# Patient Record
Sex: Female | Born: 1970 | Race: White | Hispanic: No | Marital: Married | Smoking: Never smoker
Health system: Southern US, Community
[De-identification: ages and names within clinical notes are randomized; demographics above are authoritative.]

## PROBLEM LIST (undated history)

## (undated) DIAGNOSIS — E063 Autoimmune thyroiditis: Secondary | ICD-10-CM

## (undated) DIAGNOSIS — K5900 Constipation, unspecified: Secondary | ICD-10-CM

## (undated) DIAGNOSIS — E559 Vitamin D deficiency, unspecified: Secondary | ICD-10-CM

## (undated) DIAGNOSIS — D259 Leiomyoma of uterus, unspecified: Secondary | ICD-10-CM

## (undated) DIAGNOSIS — T7840XA Allergy, unspecified, initial encounter: Secondary | ICD-10-CM

## (undated) DIAGNOSIS — E041 Nontoxic single thyroid nodule: Secondary | ICD-10-CM

## (undated) DIAGNOSIS — I10 Essential (primary) hypertension: Secondary | ICD-10-CM

## (undated) DIAGNOSIS — E039 Hypothyroidism, unspecified: Secondary | ICD-10-CM

## (undated) DIAGNOSIS — N920 Excessive and frequent menstruation with regular cycle: Secondary | ICD-10-CM

## (undated) DIAGNOSIS — E669 Obesity, unspecified: Secondary | ICD-10-CM

## (undated) HISTORY — DX: Constipation, unspecified: K59.00

## (undated) HISTORY — DX: Obesity, unspecified: E66.9

## (undated) HISTORY — DX: Allergy, unspecified, initial encounter: T78.40XA

## (undated) HISTORY — DX: Hypothyroidism, unspecified: E03.9

## (undated) HISTORY — DX: Essential (primary) hypertension: I10

## (undated) HISTORY — DX: Nontoxic single thyroid nodule: E04.1

## (undated) HISTORY — DX: Vitamin D deficiency, unspecified: E55.9

---
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
## 1997-08-21 HISTORY — PX: LAPAROSCOPIC CHOLECYSTECTOMY: SUR755

## 1997-09-18 HISTORY — PX: TUBAL LIGATION: SHX77

## 2005-07-21 HISTORY — PX: HYSTEROSCOPY WITH D & C: SHX1775

## 2013-01-04 ENCOUNTER — Ambulatory Visit: Payer: Self-pay | Admitting: Physician Assistant

## 2013-01-07 LAB — TSH: TSH: 10 u[IU]/mL — AB (ref <=5.90)

## 2013-07-22 ENCOUNTER — Ambulatory Visit: Payer: Self-pay | Admitting: Internal Medicine

## 2014-03-03 ENCOUNTER — Ambulatory Visit: Payer: Self-pay | Admitting: Physician Assistant

## 2014-03-24 ENCOUNTER — Other Ambulatory Visit: Payer: Self-pay | Admitting: Otolaryngology

## 2014-03-24 DIAGNOSIS — E041 Nontoxic single thyroid nodule: Secondary | ICD-10-CM

## 2014-04-04 LAB — TSH: TSH: 8.3 u[IU]/mL — AB (ref <=5.90)

## 2014-04-05 ENCOUNTER — Ambulatory Visit
Admission: RE | Admit: 2014-04-05 | Discharge: 2014-04-05 | Disposition: A | Discharge status: Home / Self Care | Payer: BC Managed Care – PPO | Source: Ambulatory Visit | Attending: Otolaryngology | Admitting: Otolaryngology

## 2014-04-05 ENCOUNTER — Other Ambulatory Visit: Payer: Self-pay | Admitting: Otolaryngology

## 2014-04-05 DIAGNOSIS — E041 Nontoxic single thyroid nodule: Secondary | ICD-10-CM

## 2014-04-06 ENCOUNTER — Encounter: Payer: Self-pay | Admitting: Internal Medicine

## 2014-04-24 ENCOUNTER — Encounter: Payer: Self-pay | Admitting: Endocrinology

## 2014-04-24 ENCOUNTER — Ambulatory Visit (INDEPENDENT_AMBULATORY_CARE_PROVIDER_SITE_OTHER): Payer: BC Managed Care – PPO | Admitting: Endocrinology

## 2014-04-24 VITALS — BP 142/92 | HR 98 | Temp 97.6°F | Resp 14 | Ht 63.0 in | Wt 163.2 lb

## 2014-04-24 DIAGNOSIS — R5383 Other fatigue: Secondary | ICD-10-CM

## 2014-04-24 DIAGNOSIS — E039 Hypothyroidism, unspecified: Secondary | ICD-10-CM

## 2014-04-24 MED ORDER — SYNTHROID 125 MCG PO TABS: 125.0000 ug | ORAL_TABLET | Freq: Every day | ORAL | Status: DC

## 2014-04-24 NOTE — Progress Notes (Signed)
Patient ID: Brenda Schneider, female   DOB: 09/29/1970, 43 y.o.   MRN: [id number]   Reason for Appointment:  Hypothyroidism, new visit    History of Present Illness:   Her hypothyroidism  was first diagnosed in 2011  At that time she was having the following symptoms: fatigue, weight gain, depression, heavy menstrual cycles, headaches and higher blood pressure. She had symptoms for about  6 months before seen by her physician Not clear what her initial thyroid levels were but she was told to have hypothyroidism and started on the brand name Synthroid 100 mcg With this she felt significantly better although not back to normal Apparently her dose was continued unchanged for a couple of years  She has been with a new physician locally for the last 2 years or so and she thinks her symptoms of hypothyroidism have been present to some degree as above. She does tend to have fatigue but also has good and bad days  She has extensive records over the last year and a half and these were reviewed  She has been getting the generic Synthroid and the dose was increased to 112 mcg in 2014 Apparently when her TSH was relatively low in 10/14  the dose was reduced, followup level in 1/15 is not available Until 9/15 she was taking 100 mcg alternating with 112 and since then has been taking 112 mcg She still has fatigue and some headaches She has lost weight with the help of phentermine  Lab Results  Component Value Date   TSH 8.30* 04/04/2014   TSH 10.00* 01/07/2013      No past medical history on file.  No past surgical history on file.  Family History  Problem Relation Age of Onset  . Cancer Mother   . Thyroid disease Paternal Grandmother   . Heart disease Paternal Grandmother   . Diabetes Neg Hx     Social History:  reports that she has never smoked. She has never used smokeless tobacco. Her alcohol and drug histories are not on file.  Allergies: No Known Allergies    Medication List        This list is accurate as of: 04/24/14  5:13 PM.  Always use your most recent med list.               amoxicillin 500 MG tablet  Commonly known as:  AMOXIL  Take 500 mg by mouth 2 (two) times daily.     ibuprofen 400 MG tablet  Commonly known as:  ADVIL,MOTRIN  Take 400 mg by mouth every 6 (six) hours as needed.     phentermine 15 MG capsule  Take 15 mg by mouth every morning.     SYNTHROID 125 MCG tablet  Generic drug:  levothyroxine  Take 1 tablet (125 mcg total) by mouth daily before breakfast.     triamterene-hydrochlorothiazide 37.5-25 MG per capsule  Commonly known as:  DYAZIDE  Take 1 capsule by mouth daily.        Review of Systems:  CARDIOLOGY:  she has a  history of mildly increased blood pressure, currently on Dyazide        GASTROENTEROLOGY:  no Change in bowel habits.      ENDOCRINOLOGY:  no history of Diabetes.      She has lost about 30 pounds in the last 6-9 months with the use of Phentermine 37.5 mg from PCP No history of swelling of the feet  No history of numbness or tingling in  her feet  She has regular menstrual cycles, somewhat heavy at times   Examination:    BP 142/92  Pulse 98  Temp(Src) 97.6 F (36.4 C)  Resp 14  Ht 5\' 3"  (1.6 m)  Wt 163 lb 3.2 oz (74.027 kg)  BMI 28.92 kg/m2  SpO2 96%   General Appearance: pleasant, mild generalized obesity present. No cushingoid features          Eyes: No proptosis or eyelid swelling .          Neck: The thyroid is just palpable and soft in the right side, no nodule felt to There is no lymphadenopathy .    Cardiovascular: Normal  heart sounds, no murmur Respiratory:  Lungs clear Gastrointestinal: Exam not indicated, abdomen nondistended      Neurological: REFLEXES: at biceps are normal.     Skin:  warm, no rash, pigmentary changes or hirsutism        Assessments:   1. Hypothyroidism primary secondary to Hashimoto thyroiditis and minimal thyroid enlargement or Over the last year appears to  be needing relatively higher doses of thyroid supplement Since she continues to have relatively high TSH of 8.3 with an average of 106 mcg of levothyroxine we'll need to increase her dose significantly Also discussed needing to try a brand name Synthroid because of inconsistencies in her therapeutic response over the last year and a half  2. Fatigue is likely to be from hypothyroidism Discussed that if she does not have consistent improvement in her energy level may possibly try Armour Thyroid  3. Generalized obesity. She has lost a significant amount of weight with phentermine but appears to have increased blood pressure and sinus tachycardia. Advised to discuss stopping phentermine with PCP as it is not advisable long-term  Treatment:  She will start brand name Synthroid 125 mcg daily Will have followup thyroid levels done in 6 weeks    Brenda Schneider 04/24/2014, 5:13 PM

## 2014-06-02 ENCOUNTER — Other Ambulatory Visit (INDEPENDENT_AMBULATORY_CARE_PROVIDER_SITE_OTHER): Payer: BC Managed Care – PPO

## 2014-06-02 DIAGNOSIS — E039 Hypothyroidism, unspecified: Secondary | ICD-10-CM

## 2014-06-02 LAB — T4, FREE: Free T4: 0.95 ng/dL (ref 0.60–1.60)

## 2014-06-02 LAB — TSH: TSH: 4.17 u[IU]/mL (ref 0.35–4.50)

## 2014-06-05 ENCOUNTER — Ambulatory Visit (INDEPENDENT_AMBULATORY_CARE_PROVIDER_SITE_OTHER): Payer: BC Managed Care – PPO | Admitting: Endocrinology

## 2014-06-05 ENCOUNTER — Encounter: Payer: Self-pay | Admitting: Endocrinology

## 2014-06-05 VITALS — BP 138/76 | HR 93 | Temp 97.7°F | Resp 16 | Ht 63.0 in | Wt 169.2 lb

## 2014-06-05 DIAGNOSIS — E038 Other specified hypothyroidism: Secondary | ICD-10-CM

## 2014-06-05 DIAGNOSIS — E063 Autoimmune thyroiditis: Secondary | ICD-10-CM | POA: Insufficient documentation

## 2014-06-05 NOTE — Progress Notes (Signed)
Patient ID: Brenda Schneider, female   DOB: 1970/12/06, 43 y.o.   MRN: [id number]   Reason for Appointment:  Hypothyroidism, new visit    History of Present Illness:   Her hypothyroidism  was first diagnosed in 2011  At that time she was having the following symptoms: fatigue, weight gain, depression, heavy menstrual cycles, headaches and higher blood pressure. She had symptoms for about  6 months before seen by her physician Not clear what her initial thyroid levels were but she was told to have hypothyroidism and started on brand name Synthroid 100 mcg With this she felt significantly better although not back to normal Apparently her dose was continued unchanged for a couple of years  She has been with a new physician locally for the last 2 years or so Prior to her initial visit she was having some of her symptoms of hypothyroidism including fatigue and decreased memory She was getting the generic Synthroid and the dose was increased to 112 mcg in 2014 Apparently when her TSH was relatively low in 10/14  the dose was reduced  Until 9/15 she was taking 100 mcg alternating with 112 and subsequently taking 112 mcg  She was changed to brand name Synthroid 125 g in 10/15 and with this her fatigue has improved.  Also has less memory lapses. She has regained some weight recently since she went off her phentermine and also was given prednisone for respiratory infection   Lab Results  Component Value Date   FREET4 0.95 06/02/2014   TSH 4.17 06/02/2014   TSH 8.30* 04/04/2014   TSH 10.00* 01/07/2013      No past medical history on file.  No past surgical history on file.  Family History  Problem Relation Age of Onset  . Cancer Mother   . Thyroid disease Paternal Grandmother   . Heart disease Paternal Grandmother   . Diabetes Neg Hx     Social History:  reports that she has never smoked. She has never used smokeless tobacco. Her alcohol and drug histories are not on  file.  Allergies: No Known Allergies    Medication List       This list is accurate as of: 06/05/14  4:51 PM.  Always use your most recent med list.               ibuprofen 400 MG tablet  Commonly known as:  ADVIL,MOTRIN  Take 400 mg by mouth every 6 (six) hours as needed.     SYNTHROID 125 MCG tablet  Generic drug:  levothyroxine  Take 1 tablet (125 mcg total) by mouth daily before breakfast.     triamterene-hydrochlorothiazide 37.5-25 MG per tablet  Commonly known as:  MAXZIDE-25        Review of Systems:  She had lost about 30 pounds in the last 6-9 months with the use of Phentermine 37.5 mg from PCP which has now been stopped   Wt Readings from Last 3 Encounters:  06/05/14 169 lb 3.2 oz (76.749 kg)  04/24/14 163 lb 3.2 oz (74.027 kg)    CARDIOLOGY:  she has a  history of mildly increased blood pressure, currently on Dyazide         She has regular menstrual cycles, somewhat heavy at times   Examination:    BP 138/76 mmHg  Pulse 93  Temp(Src) 97.7 F (36.5 C)  Resp 16  Ht 5\' 3"  (1.6 m)  Wt 169 lb 3.2 oz (76.749 kg)  BMI 29.98 kg/m2  SpO2 98%   General Appearance: she looks well     Neck: The thyroid is just palpable and soft in the right side, no nodule felt to  Neurological: REFLEXES: at biceps are normal.       Assessments:  Hypothyroidism, primary secondary to Hashimoto thyroiditis and minimal thyroid enlargement  She is subjectively doing better and her TSH is upper normal now with changing her dose about 5-6 weeks ago  Treatment:  She will continue brand name Synthroid 125 mcg daily Will have followup in 3 months    Aylah Yeary 06/05/2014, 4:51 PM

## 2014-08-05 ENCOUNTER — Other Ambulatory Visit: Payer: Self-pay | Admitting: Endocrinology

## 2014-08-23 ENCOUNTER — Other Ambulatory Visit (INDEPENDENT_AMBULATORY_CARE_PROVIDER_SITE_OTHER): Payer: Self-pay

## 2014-08-23 DIAGNOSIS — E063 Autoimmune thyroiditis: Secondary | ICD-10-CM

## 2014-08-23 DIAGNOSIS — E038 Other specified hypothyroidism: Secondary | ICD-10-CM

## 2014-08-23 LAB — TSH: TSH: 2.32 u[IU]/mL (ref 0.35–4.50)

## 2014-08-23 LAB — T4, FREE: FREE T4: 1.07 ng/dL (ref 0.60–1.60)

## 2014-08-30 ENCOUNTER — Other Ambulatory Visit: Payer: BC Managed Care – PPO

## 2014-09-04 ENCOUNTER — Encounter: Payer: Self-pay | Admitting: Endocrinology

## 2014-09-04 ENCOUNTER — Ambulatory Visit (INDEPENDENT_AMBULATORY_CARE_PROVIDER_SITE_OTHER): Payer: BC Managed Care – PPO | Admitting: Endocrinology

## 2014-09-04 VITALS — BP 114/70 | HR 87 | Temp 98.6°F | Resp 12 | Wt 179.0 lb

## 2014-09-04 DIAGNOSIS — E063 Autoimmune thyroiditis: Secondary | ICD-10-CM

## 2014-09-04 DIAGNOSIS — E038 Other specified hypothyroidism: Secondary | ICD-10-CM

## 2014-09-04 NOTE — Progress Notes (Signed)
Patient ID: Brenda Schneider, female   DOB: Jan 11, 1971, 44 y.o.   MRN: [id number]   Reason for Appointment:  Hypothyroidism, new visit    History of Present Illness:   HYPOTHYROIDISM  was first diagnosed in 2011  At that time she was having the following symptoms: fatigue, weight gain, depression, heavy menstrual cycles, headaches and higher blood pressure. She had symptoms for about  6 months before seen by her physician Not clear what her initial thyroid levels were but she was told to have hypothyroidism and started on brand name Synthroid 100 mcg With this she felt significantly better although not back to normal Apparently her dose was continued unchanged for a couple of years  Prior to her initial visit she was having some of her symptoms of hypothyroidism including fatigue and decreased memory She was getting the generic Synthroid and the dose was increased to 112 mcg in 2014 Apparently when her TSH was relatively low in 10/14  the dose was reduced  Until 9/15 she was taking 100 mcg alternating with 112 and subsequently taking 112 mcg  Recent history: She was changed to brand name Synthroid 125 g in 10/15 and with this her fatigue improved.  Also has had less memory lapses. Since her follow-up TSH was done right after her change the dose was continued Her TSH is now quite normal She still continues to feel very well with her energy level and has normal brain fog She has not started working on her weight loss as yet Compliant with taking the medication before breakfast daily  Labs:  Lab Results  Component Value Date   FREET4 1.07 08/23/2014   FREET4 0.95 06/02/2014   TSH 2.32 08/23/2014   TSH 4.17 06/02/2014   TSH 8.30* 04/04/2014      No past medical history on file.  No past surgical history on file.  Family History  Problem Relation Age of Onset  . Cancer Mother   . Thyroid disease Paternal Grandmother   . Heart disease Paternal Grandmother   .  Diabetes Neg Hx     Social History:  reports that she has never smoked. She has never used smokeless tobacco. Her alcohol and drug histories are not on file.  Allergies: No Known Allergies    Medication List       This list is accurate as of: 09/04/14  4:35 PM.  Always use your most recent med list.               ibuprofen 400 MG tablet  Commonly known as:  ADVIL,MOTRIN  Take 400 mg by mouth every 6 (six) hours as needed.     SYNTHROID 125 MCG tablet  Generic drug:  levothyroxine  TAKE 1 TABLET (125 MCG TOTAL) BY MOUTH DAILY BEFORE BREAKFAST.     triamterene-hydrochlorothiazide 37.5-25 MG per tablet  Commonly known as:  MAXZIDE-25  Take 1 tablet by mouth daily.        Review of Systems:  She had lost about 30 pounds before with the use of Phentermine 37.5 mg from PCP     Wt Readings from Last 3 Encounters:  09/04/14 179 lb (81.194 kg)  06/05/14 169 lb 3.2 oz (76.749 kg)  04/24/14 163 lb 3.2 oz (74.027 kg)    She has a  history of mildly increased blood pressure, currently on Dyazide from PCP        She has regular menstrual cycles, somewhat heavy at times   Examination:  BP 114/70 mmHg  Pulse 87  Temp(Src) 98.6 F (37 C) (Oral)  Resp 12  Wt 179 lb (81.194 kg)  SpO2 98%   General Appearance: she looks well     Neck: The thyroid is not palpable Neurological: REFLEXES: at biceps are normal.       Assessments:  Hypothyroidism, primary secondary to Hashimoto thyroiditis and minimal thyroid enlargement  She is subjectively doing better and her TSH is quite normal now with brand name Synthroid 124 g  Treatment:  She will continue brand name Synthroid 125 mcg daily Will have followup in 6 months, advised her to call if she is starting to have unusual fatigue    Vetra Shinall 09/04/2014, 4:35 PM

## 2014-11-24 ENCOUNTER — Other Ambulatory Visit: Payer: Self-pay | Admitting: Endocrinology

## 2014-12-15 ENCOUNTER — Other Ambulatory Visit: Payer: Self-pay | Admitting: Endocrinology

## 2015-03-07 ENCOUNTER — Other Ambulatory Visit: Payer: BLUE CROSS/BLUE SHIELD

## 2015-03-12 ENCOUNTER — Ambulatory Visit: Payer: BLUE CROSS/BLUE SHIELD | Admitting: Endocrinology

## 2015-03-12 DIAGNOSIS — Z0289 Encounter for other administrative examinations: Secondary | ICD-10-CM

## 2015-10-25 NOTE — H&P (Signed)
  45 year old G 3 P 3 with menorrhagia.  Periods last 7 to 10 days Ultrasound revealed 4.5 cm fibroid obliterating her endometrium  Past Medical History  Diagnosis Date  . Hypertension    Hypothyroidism  Past Surgical History Lap Chole BTL D and C Tonsillectomy  Allergies: NKDA  Medications Synthroid  Ibuprofen  There were no vitals taken for this visit.  Family history  Hypertension Asthma  ROS: Unremarkable  General alert and oriented Lung CTAB Car RRR Abdomen is sosft and non tender  Pelvic Uterus  Anteverted 10 week size   IMPRESSION Symptomatic Fibroids  PLAN: LAVH and bilateral salpingectomy Risks reviewed Consent signed

## 2015-10-26 ENCOUNTER — Encounter (HOSPITAL_BASED_OUTPATIENT_CLINIC_OR_DEPARTMENT_OTHER): Payer: Self-pay | Admitting: *Deleted

## 2015-10-29 ENCOUNTER — Encounter (HOSPITAL_BASED_OUTPATIENT_CLINIC_OR_DEPARTMENT_OTHER): Payer: Self-pay | Admitting: *Deleted

## 2015-10-29 NOTE — Progress Notes (Signed)
NPO AFTER MN.  ARRIVE AT 0600.  NEEDS URINE PREG.  GETTING CBC AND T & S DONE Thursday 10-31-2015.  WILL TAKE SYNTHROID AM DOS W/ SIPS OF WATER.

## 2015-10-31 DIAGNOSIS — E039 Hypothyroidism, unspecified: Secondary | ICD-10-CM | POA: Diagnosis not present

## 2015-10-31 DIAGNOSIS — Z6834 Body mass index (BMI) 34.0-34.9, adult: Secondary | ICD-10-CM | POA: Diagnosis not present

## 2015-10-31 DIAGNOSIS — D259 Leiomyoma of uterus, unspecified: Secondary | ICD-10-CM | POA: Diagnosis not present

## 2015-10-31 DIAGNOSIS — N72 Inflammatory disease of cervix uteri: Secondary | ICD-10-CM | POA: Diagnosis not present

## 2015-10-31 DIAGNOSIS — I10 Essential (primary) hypertension: Secondary | ICD-10-CM | POA: Diagnosis not present

## 2015-10-31 DIAGNOSIS — N920 Excessive and frequent menstruation with regular cycle: Secondary | ICD-10-CM | POA: Diagnosis present

## 2015-10-31 LAB — CBC
HCT: 34.4 % — ABNORMAL LOW (ref 36.0–46.0)
Hemoglobin: 10.9 g/dL — ABNORMAL LOW (ref 12.0–15.0)
MCH: 24 pg — ABNORMAL LOW (ref 26.0–34.0)
MCHC: 31.7 g/dL (ref 30.0–36.0)
MCV: 75.8 fL — ABNORMAL LOW (ref 78.0–100.0)
PLATELETS: 274 10*3/uL (ref 150–400)
RBC: 4.54 MIL/uL (ref 3.87–5.11)
RDW: 14.4 % (ref 11.5–15.5)
WBC: 6.7 10*3/uL (ref 4.0–10.5)

## 2015-10-31 LAB — ABO/RH: ABO/RH(D): A NEG

## 2015-11-01 ENCOUNTER — Observation Stay (HOSPITAL_BASED_OUTPATIENT_CLINIC_OR_DEPARTMENT_OTHER)
Admission: AD | Admit: 2015-11-01 | Discharge: 2015-11-02 | Disposition: A | Discharge status: Home / Self Care | Payer: Managed Care, Other (non HMO) | Source: Ambulatory Visit | Attending: Obstetrics and Gynecology | Admitting: Obstetrics and Gynecology

## 2015-11-01 ENCOUNTER — Encounter (HOSPITAL_COMMUNITY)
Admission: AD | Disposition: A | Discharge status: Home / Self Care | Payer: Self-pay | Source: Ambulatory Visit | Attending: Obstetrics and Gynecology

## 2015-11-01 ENCOUNTER — Encounter (HOSPITAL_BASED_OUTPATIENT_CLINIC_OR_DEPARTMENT_OTHER): Payer: Self-pay | Admitting: *Deleted

## 2015-11-01 ENCOUNTER — Ambulatory Visit (HOSPITAL_BASED_OUTPATIENT_CLINIC_OR_DEPARTMENT_OTHER): Payer: Managed Care, Other (non HMO) | Admitting: Anesthesiology

## 2015-11-01 DIAGNOSIS — Z6834 Body mass index (BMI) 34.0-34.9, adult: Secondary | ICD-10-CM | POA: Insufficient documentation

## 2015-11-01 DIAGNOSIS — D259 Leiomyoma of uterus, unspecified: Secondary | ICD-10-CM | POA: Insufficient documentation

## 2015-11-01 DIAGNOSIS — I10 Essential (primary) hypertension: Secondary | ICD-10-CM | POA: Insufficient documentation

## 2015-11-01 DIAGNOSIS — E039 Hypothyroidism, unspecified: Secondary | ICD-10-CM | POA: Insufficient documentation

## 2015-11-01 DIAGNOSIS — N920 Excessive and frequent menstruation with regular cycle: Secondary | ICD-10-CM | POA: Diagnosis not present

## 2015-11-01 DIAGNOSIS — N72 Inflammatory disease of cervix uteri: Secondary | ICD-10-CM | POA: Insufficient documentation

## 2015-11-01 DIAGNOSIS — Z90710 Acquired absence of both cervix and uterus: Secondary | ICD-10-CM | POA: Diagnosis present

## 2015-11-01 HISTORY — DX: Leiomyoma of uterus, unspecified: D25.9

## 2015-11-01 HISTORY — PX: LAPAROSCOPIC VAGINAL HYSTERECTOMY WITH SALPINGECTOMY: SHX6680

## 2015-11-01 HISTORY — DX: Autoimmune thyroiditis: E06.3

## 2015-11-01 HISTORY — DX: Excessive and frequent menstruation with regular cycle: N92.0

## 2015-11-01 LAB — POCT PREGNANCY, URINE: PREG TEST UR: NEGATIVE

## 2015-11-01 SURGERY — HYSTERECTOMY, VAGINAL, LAPAROSCOPY-ASSISTED, WITH SALPINGECTOMY
Anesthesia: General | Site: Abdomen | Laterality: Bilateral

## 2015-11-01 MED ORDER — TRAMADOL HCL 50 MG PO TABS
50.0000 mg | ORAL_TABLET | Freq: Four times a day (QID) | ORAL | Status: DC | PRN
  Administered 2015-11-02: 50 mg via ORAL
  Filled 2015-11-01 (×2): qty 1

## 2015-11-01 MED ORDER — SCOPOLAMINE 1 MG/3DAYS TD PT72
MEDICATED_PATCH | TRANSDERMAL | Status: DC | PRN
  Administered 2015-11-01: 1 via TRANSDERMAL

## 2015-11-01 MED ORDER — LIDOCAINE HCL 4 % EX SOLN
CUTANEOUS | Status: DC | PRN
  Administered 2015-11-01: 2 mL via TOPICAL

## 2015-11-01 MED ORDER — KETOROLAC TROMETHAMINE 30 MG/ML IJ SOLN
30.0000 mg | Freq: Once | INTRAMUSCULAR | Status: AC
  Administered 2015-11-01: 30 mg via INTRAVENOUS
  Filled 2015-11-01: qty 1

## 2015-11-01 MED ORDER — DEXAMETHASONE SODIUM PHOSPHATE 4 MG/ML IJ SOLN
INTRAMUSCULAR | Status: DC | PRN
  Administered 2015-11-01: 10 mg via INTRAVENOUS

## 2015-11-01 MED ORDER — LACTATED RINGERS IR SOLN
Status: DC | PRN
  Administered 2015-11-01: 3000 mL

## 2015-11-01 MED ORDER — CEFAZOLIN SODIUM-DEXTROSE 2-4 GM/100ML-% IV SOLN
INTRAVENOUS | Status: AC
  Filled 2015-11-01: qty 100

## 2015-11-01 MED ORDER — KETOROLAC TROMETHAMINE 30 MG/ML IJ SOLN
INTRAMUSCULAR | Status: AC
  Filled 2015-11-01: qty 1

## 2015-11-01 MED ORDER — CEFAZOLIN SODIUM-DEXTROSE 2-4 GM/100ML-% IV SOLN
2.0000 g | INTRAVENOUS | Status: AC
  Administered 2015-11-01: 2 g via INTRAVENOUS
  Filled 2015-11-01: qty 100

## 2015-11-01 MED ORDER — IBUPROFEN 200 MG PO TABS
600.0000 mg | ORAL_TABLET | Freq: Four times a day (QID) | ORAL | Status: DC | PRN
  Administered 2015-11-02: 600 mg via ORAL
  Filled 2015-11-01: qty 3
  Filled 2015-11-01: qty 1

## 2015-11-01 MED ORDER — LEVOTHYROXINE SODIUM 175 MCG PO TABS
175.0000 ug | ORAL_TABLET | Freq: Every day | ORAL | Status: DC
  Administered 2015-11-02: 175 ug via ORAL
  Filled 2015-11-01 (×3): qty 1

## 2015-11-01 MED ORDER — MENTHOL 3 MG MT LOZG
1.0000 | LOZENGE | OROMUCOSAL | Status: DC | PRN
  Filled 2015-11-01: qty 9

## 2015-11-01 MED ORDER — LACTATED RINGERS IV SOLN
INTRAVENOUS | Status: DC
  Administered 2015-11-01 (×4): via INTRAVENOUS
  Filled 2015-11-01: qty 1000

## 2015-11-01 MED ORDER — MIDAZOLAM HCL 5 MG/5ML IJ SOLN
INTRAMUSCULAR | Status: DC | PRN
  Administered 2015-11-01: 2 mg via INTRAVENOUS

## 2015-11-01 MED ORDER — GLYCOPYRROLATE 0.2 MG/ML IJ SOLN
INTRAMUSCULAR | Status: DC | PRN
  Administered 2015-11-01: 0.6 mg via INTRAVENOUS

## 2015-11-01 MED ORDER — ONDANSETRON HCL 4 MG/2ML IJ SOLN
INTRAMUSCULAR | Status: DC | PRN
  Administered 2015-11-01: 4 mg via INTRAVENOUS

## 2015-11-01 MED ORDER — GLYCOPYRROLATE 0.2 MG/ML IJ SOLN
INTRAMUSCULAR | Status: AC
  Filled 2015-11-01: qty 3

## 2015-11-01 MED ORDER — ONDANSETRON HCL 4 MG/2ML IJ SOLN
INTRAMUSCULAR | Status: AC
  Filled 2015-11-01: qty 2

## 2015-11-01 MED ORDER — MIDAZOLAM HCL 2 MG/2ML IJ SOLN
0.5000 mg | Freq: Once | INTRAMUSCULAR | Status: DC | PRN
  Filled 2015-11-01: qty 2

## 2015-11-01 MED ORDER — DIPHENHYDRAMINE HCL 12.5 MG/5ML PO ELIX
12.5000 mg | ORAL_SOLUTION | Freq: Four times a day (QID) | ORAL | Status: DC | PRN
  Filled 2015-11-01: qty 5

## 2015-11-01 MED ORDER — PROPOFOL 10 MG/ML IV BOLUS
INTRAVENOUS | Status: DC | PRN
  Administered 2015-11-01: 50 mg via INTRAVENOUS
  Administered 2015-11-01: 160 mg via INTRAVENOUS

## 2015-11-01 MED ORDER — ONDANSETRON HCL 4 MG/2ML IJ SOLN
4.0000 mg | Freq: Four times a day (QID) | INTRAMUSCULAR | Status: DC | PRN
  Filled 2015-11-01: qty 2

## 2015-11-01 MED ORDER — LIDOCAINE HCL (CARDIAC) 20 MG/ML IV SOLN
INTRAVENOUS | Status: DC | PRN
  Administered 2015-11-01: 60 mg via INTRAVENOUS

## 2015-11-01 MED ORDER — HYDROMORPHONE HCL 1 MG/ML IJ SOLN
INTRAMUSCULAR | Status: AC
  Filled 2015-11-01: qty 1

## 2015-11-01 MED ORDER — PROMETHAZINE HCL 25 MG/ML IJ SOLN
6.2500 mg | INTRAMUSCULAR | Status: DC | PRN
  Filled 2015-11-01: qty 1

## 2015-11-01 MED ORDER — FENTANYL CITRATE (PF) 250 MCG/5ML IJ SOLN
INTRAMUSCULAR | Status: AC
  Filled 2015-11-01: qty 5

## 2015-11-01 MED ORDER — ROCURONIUM BROMIDE 100 MG/10ML IV SOLN
INTRAVENOUS | Status: AC
  Filled 2015-11-01: qty 1

## 2015-11-01 MED ORDER — HYDROMORPHONE HCL 1 MG/ML IJ SOLN
0.2500 mg | INTRAMUSCULAR | Status: DC | PRN
  Administered 2015-11-01 (×4): 0.5 mg via INTRAVENOUS
  Filled 2015-11-01: qty 1

## 2015-11-01 MED ORDER — LACTATED RINGERS IV SOLN
INTRAVENOUS | Status: DC
  Filled 2015-11-01: qty 1000

## 2015-11-01 MED ORDER — MEPERIDINE HCL 25 MG/ML IJ SOLN
6.2500 mg | INTRAMUSCULAR | Status: DC | PRN
  Filled 2015-11-01: qty 1

## 2015-11-01 MED ORDER — NALOXONE HCL 0.4 MG/ML IJ SOLN
0.4000 mg | INTRAMUSCULAR | Status: DC | PRN
  Filled 2015-11-01: qty 1

## 2015-11-01 MED ORDER — SUCCINYLCHOLINE CHLORIDE 20 MG/ML IJ SOLN
INTRAMUSCULAR | Status: DC | PRN
Start: 2015-11-01 — End: 2015-11-01
  Administered 2015-11-01: 100 mg via INTRAVENOUS

## 2015-11-01 MED ORDER — DEXAMETHASONE SODIUM PHOSPHATE 10 MG/ML IJ SOLN
INTRAMUSCULAR | Status: AC
  Filled 2015-11-01: qty 1

## 2015-11-01 MED ORDER — LIDOCAINE HCL (CARDIAC) 20 MG/ML IV SOLN
INTRAVENOUS | Status: AC
  Filled 2015-11-01: qty 5

## 2015-11-01 MED ORDER — BUPIVACAINE HCL (PF) 0.25 % IJ SOLN
INTRAMUSCULAR | Status: DC | PRN
  Administered 2015-11-01: 10 mL

## 2015-11-01 MED ORDER — ROCURONIUM BROMIDE 100 MG/10ML IV SOLN
INTRAVENOUS | Status: DC | PRN
  Administered 2015-11-01: 10 mg via INTRAVENOUS
  Administered 2015-11-01: 30 mg via INTRAVENOUS
  Administered 2015-11-01: 10 mg via INTRAVENOUS

## 2015-11-01 MED ORDER — MIDAZOLAM HCL 2 MG/2ML IJ SOLN
INTRAMUSCULAR | Status: AC
  Filled 2015-11-01: qty 2

## 2015-11-01 MED ORDER — SODIUM CHLORIDE 0.9% FLUSH
9.0000 mL | INTRAVENOUS | Status: DC | PRN
  Filled 2015-11-01: qty 9

## 2015-11-01 MED ORDER — HYDROMORPHONE 1 MG/ML IV SOLN
INTRAVENOUS | Status: DC
  Administered 2015-11-01: 13:00:00 via INTRAVENOUS
  Administered 2015-11-01: 0.4 mg via INTRAVENOUS
  Administered 2015-11-02: 0.2 mg via INTRAVENOUS
  Filled 2015-11-01 (×2): qty 25

## 2015-11-01 MED ORDER — SCOPOLAMINE 1 MG/3DAYS TD PT72
MEDICATED_PATCH | TRANSDERMAL | Status: AC
  Filled 2015-11-01: qty 1

## 2015-11-01 MED ORDER — DIPHENHYDRAMINE HCL 50 MG/ML IJ SOLN
12.5000 mg | Freq: Four times a day (QID) | INTRAMUSCULAR | Status: DC | PRN
  Filled 2015-11-01: qty 0.25

## 2015-11-01 MED ORDER — PROPOFOL 10 MG/ML IV BOLUS
INTRAVENOUS | Status: AC
  Filled 2015-11-01: qty 40

## 2015-11-01 MED ORDER — FENTANYL CITRATE (PF) 100 MCG/2ML IJ SOLN
INTRAMUSCULAR | Status: DC | PRN
  Administered 2015-11-01 (×5): 50 ug via INTRAVENOUS

## 2015-11-01 MED ORDER — KETOROLAC TROMETHAMINE 30 MG/ML IJ SOLN
INTRAMUSCULAR | Status: DC | PRN
  Administered 2015-11-01: 30 mg via INTRAVENOUS

## 2015-11-01 MED ORDER — NEOSTIGMINE METHYLSULFATE 10 MG/10ML IV SOLN
INTRAVENOUS | Status: DC | PRN
  Administered 2015-11-01: 4 mg via INTRAVENOUS

## 2015-11-01 MED ORDER — NEOSTIGMINE METHYLSULFATE 10 MG/10ML IV SOLN
INTRAVENOUS | Status: AC
  Filled 2015-11-01: qty 1

## 2015-11-01 SURGICAL SUPPLY — 75 items
BAG URINE DRAINAGE (UROLOGICAL SUPPLIES) ×2 IMPLANT
BANDAGE ADHESIVE 1X3 (GAUZE/BANDAGES/DRESSINGS) IMPLANT
BANDAGE CO FLEX L/F 1IN X 5YD (GAUZE/BANDAGES/DRESSINGS) ×2 IMPLANT
BARRIER ADHS 3X4 INTERCEED (GAUZE/BANDAGES/DRESSINGS) IMPLANT
BLADE CLIPPER SURG (BLADE) ×2 IMPLANT
BLADE SURG 11 STRL SS (BLADE) ×2 IMPLANT
CANISTER SUCTION 2500CC (MISCELLANEOUS) IMPLANT
CATH FOLEY 2WAY SLVR  5CC 14FR (CATHETERS) ×1
CATH FOLEY 2WAY SLVR 5CC 14FR (CATHETERS) ×1 IMPLANT
CHLORAPREP W/TINT 26ML (MISCELLANEOUS) ×2 IMPLANT
COVER BACK TABLE 60X90IN (DRAPES) ×4 IMPLANT
DRAPE LG THREE QUARTER DISP (DRAPES) ×2 IMPLANT
DRAPE UNDERBUTTOCKS STRL (DRAPE) ×2 IMPLANT
DRSG TEGADERM 2-3/8X2-3/4 SM (GAUZE/BANDAGES/DRESSINGS) ×2 IMPLANT
DRSG TEGADERM 4X4.75 (GAUZE/BANDAGES/DRESSINGS) ×2 IMPLANT
ELECT REM PT RETURN 9FT ADLT (ELECTROSURGICAL) ×2
ELECTRODE REM PT RTRN 9FT ADLT (ELECTROSURGICAL) ×1 IMPLANT
FILTER SMOKE EVAC LAPAROSHD (FILTER) IMPLANT
GAUZE SPONGE 4X4 16PLY XRAY LF (GAUZE/BANDAGES/DRESSINGS) ×2 IMPLANT
GLOVE BIO SURGEON STRL SZ 6.5 (GLOVE) ×8 IMPLANT
GLOVE BIO SURGEON STRL SZ7 (GLOVE) ×2 IMPLANT
GLOVE BIOGEL PI IND STRL 6.5 (GLOVE) ×3 IMPLANT
GLOVE BIOGEL PI IND STRL 7.5 (GLOVE) ×1 IMPLANT
GLOVE BIOGEL PI INDICATOR 6.5 (GLOVE) ×3
GLOVE BIOGEL PI INDICATOR 7.5 (GLOVE) ×1
GLOVE ECLIPSE 6.5 STRL STRAW (GLOVE) ×2 IMPLANT
GOWN STRL REUS W/ TWL LRG LVL3 (GOWN DISPOSABLE) ×3 IMPLANT
GOWN STRL REUS W/TWL LRG LVL3 (GOWN DISPOSABLE) ×3
GOWN STRL REUS W/TWL XL LVL3 (GOWN DISPOSABLE) ×2 IMPLANT
HOLDER FOLEY CATH W/STRAP (MISCELLANEOUS) IMPLANT
KIT ROOM TURNOVER WOR (KITS) ×2 IMPLANT
LIQUID BAND (GAUZE/BANDAGES/DRESSINGS) IMPLANT
MANIFOLD NEPTUNE II (INSTRUMENTS) IMPLANT
NEEDLE HYPO 25X1 1.5 SAFETY (NEEDLE) IMPLANT
NEEDLE INSUFFLATION 14GA 120MM (NEEDLE) ×2 IMPLANT
NEEDLE INSUFFLATION 14GA 150MM (NEEDLE) IMPLANT
NEEDLE SPNL 22GX3.5 QUINCKE BK (NEEDLE) ×2 IMPLANT
NS IRRIG 500ML POUR BTL (IV SOLUTION) ×2 IMPLANT
PACK BASIN DAY SURGERY FS (CUSTOM PROCEDURE TRAY) ×2 IMPLANT
PAD OB MATERNITY 4.3X12.25 (PERSONAL CARE ITEMS) ×2 IMPLANT
PAD PREP 24X48 CUFFED NSTRL (MISCELLANEOUS) ×2 IMPLANT
PADDING ION DISPOSABLE (MISCELLANEOUS) ×2 IMPLANT
PENCIL BUTTON HOLSTER BLD 10FT (ELECTRODE) ×2 IMPLANT
POUCH SPECIMEN RETRIEVAL 10MM (ENDOMECHANICALS) IMPLANT
SCISSORS LAP 5X35 DISP (ENDOMECHANICALS) IMPLANT
SEALER TISSUE G2 CVD JAW 45CM (ENDOMECHANICALS) ×2 IMPLANT
SET IRRIG TUBING LAPAROSCOPIC (IRRIGATION / IRRIGATOR) ×2 IMPLANT
SHEET LAVH (DRAPES) ×2 IMPLANT
SOLUTION ANTI FOG 6CC (MISCELLANEOUS) ×2 IMPLANT
SPONGE LAP 4X18 X RAY DECT (DISPOSABLE) ×2 IMPLANT
SUT VIC AB 0 CT1 18XCR BRD 8 (SUTURE) ×2 IMPLANT
SUT VIC AB 0 CT1 18XCR BRD8 (SUTURE) ×2 IMPLANT
SUT VIC AB 0 CT1 36 (SUTURE) ×4 IMPLANT
SUT VIC AB 0 CT1 8-18 (SUTURE) ×4
SUT VIC AB 3-0 PS2 18 (SUTURE) ×1
SUT VIC AB 3-0 PS2 18XBRD (SUTURE) ×1 IMPLANT
SUT VIC AB 3-0 SH 27 (SUTURE)
SUT VIC AB 3-0 SH 27X BRD (SUTURE) IMPLANT
SUT VICRYL 0 TIES 12 18 (SUTURE) ×2 IMPLANT
SUT VICRYL 0 UR6 27IN ABS (SUTURE) ×2 IMPLANT
SUT VICRYL RAPIDE 3 0 (SUTURE) ×2 IMPLANT
SYR BULB IRRIGATION 50ML (SYRINGE) ×2 IMPLANT
SYR CONTROL 10ML LL (SYRINGE) IMPLANT
SYRINGE 10CC LL (SYRINGE) ×2 IMPLANT
TOWEL NATURAL 6PK STERILE (DISPOSABLE) ×4 IMPLANT
TOWEL OR 17X24 6PK STRL BLUE (TOWEL DISPOSABLE) ×4 IMPLANT
TRAY DSU PREP LF (CUSTOM PROCEDURE TRAY) ×2 IMPLANT
TROCAR OPTI TIP 5M 100M (ENDOMECHANICALS) ×2 IMPLANT
TROCAR XCEL BLUNT TIP 100MML (ENDOMECHANICALS) IMPLANT
TROCAR XCEL DIL TIP R 11M (ENDOMECHANICALS) ×2 IMPLANT
TROCAR XCEL NON-BLD 11X100MML (ENDOMECHANICALS) IMPLANT
TUBE CONNECTING 12X1/4 (SUCTIONS) ×4 IMPLANT
TUBING INSUFFLATION 10FT LAP (TUBING) IMPLANT
WATER STERILE IRR 500ML POUR (IV SOLUTION) ×2 IMPLANT
YANKAUER SUCT BULB TIP NO VENT (SUCTIONS) ×2 IMPLANT

## 2015-11-01 NOTE — Progress Notes (Signed)
H and P on the chart No changes  Will proceed with LAVH and bilateral salpingectomy Consent signed

## 2015-11-01 NOTE — Transfer of Care (Signed)
Last Vitals:  Filed Vitals:   11/01/15 0611 11/01/15 0929  BP: 157/74 155/61  Pulse: 94 84  Temp: 36.7 C 36.7 C  Resp: 16 11    Immediate Anesthesia Transfer of Care Note  Patient: Brenda Schneider  Procedure(s) Performed: Procedure(s) (LRB): LAPAROSCOPIC ASSISTED VAGINAL HYSTERECTOMY WITH SALPINGECTOMY (Bilateral)  Patient Location: PACU  Anesthesia Type: General  Level of Consciousness: awake, alert  and oriented  Airway & Oxygen Therapy: Patient Spontanous Breathing and Patient connected to nasal cannula oxygen  Post-op Assessment: Report given to PACU RN and Post -op Vital signs reviewed and stable  Post vital signs: Reviewed and stable  Complications: No apparent anesthesia complications

## 2015-11-01 NOTE — Anesthesia Postprocedure Evaluation (Signed)
Anesthesia Post Note  Patient: Brenda Schneider  Procedure(s) Performed: Procedure(s) (LRB): LAPAROSCOPIC ASSISTED VAGINAL HYSTERECTOMY WITH SALPINGECTOMY (Bilateral)  Patient location during evaluation: PACU Anesthesia Type: General Level of consciousness: awake and alert, oriented and patient cooperative Pain management: pain level controlled Vital Signs Assessment: post-procedure vital signs reviewed and stable Respiratory status: spontaneous breathing, nonlabored ventilation and respiratory function stable Cardiovascular status: blood pressure returned to baseline and stable Postop Assessment: no signs of nausea or vomiting Anesthetic complications: no    Last Vitals:  Filed Vitals:   11/01/15 1030 11/01/15 1045  BP: 160/79 161/81  Pulse: 69 70  Temp:    Resp: 18 18    Last Pain:  Filed Vitals:   11/01/15 1101  PainSc: 6                  Shyleigh Daughtry,E. Nicholos Aloisi

## 2015-11-01 NOTE — Anesthesia Procedure Notes (Signed)
Procedure Name: Intubation Date/Time: 11/01/2015 7:40 AM Performed by: Mechele Claude Pre-anesthesia Checklist: Patient identified, Emergency Drugs available, Suction available and Patient being monitored Patient Re-evaluated:Patient Re-evaluated prior to inductionOxygen Delivery Method: Circle System Utilized Preoxygenation: Pre-oxygenation with 100% oxygen Intubation Type: IV induction Ventilation: Mask ventilation without difficulty Laryngoscope Size: Mac and 3 Grade View: Grade I Tube type: Oral Tube size: 7.0 mm Number of attempts: 1 Airway Equipment and Method: Stylet and LTA kit utilized Placement Confirmation: ETT inserted through vocal cords under direct vision,  positive ETCO2 and breath sounds checked- equal and bilateral Secured at: 21 cm Tube secured with: Tape Dental Injury: Teeth and Oropharynx as per pre-operative assessment

## 2015-11-01 NOTE — Anesthesia Preprocedure Evaluation (Addendum)
Anesthesia Evaluation  Patient identified by MRN, date of birth, ID band Patient awake    Reviewed: Allergy & Precautions, NPO status , Patient's Chart, lab work & pertinent test results  History of Anesthesia Complications Negative for: history of anesthetic complications  Airway Mallampati: II  TM Distance: >3 FB Neck ROM: Full    Dental  (+) Dental Advisory Given, Teeth Intact   Pulmonary neg pulmonary ROS,    breath sounds clear to auscultation       Cardiovascular negative cardio ROS   Rhythm:Regular Rate:Normal     Neuro/Psych negative neurological ROS     GI/Hepatic negative GI ROS, Neg liver ROS,   Endo/Other  Hypothyroidism Morbid obesity  Renal/GU negative Renal ROS     Musculoskeletal   Abdominal (+) + obese,   Peds  Hematology negative hematology ROS (+)   Anesthesia Other Findings   Reproductive/Obstetrics                            Anesthesia Physical Anesthesia Plan  ASA: II  Anesthesia Plan: General   Post-op Pain Management:    Induction: Intravenous  Airway Management Planned: Oral ETT  Additional Equipment:   Intra-op Plan:   Post-operative Plan: Extubation in OR  Informed Consent: I have reviewed the patients History and Physical, chart, labs and discussed the procedure including the risks, benefits and alternatives for the proposed anesthesia with the patient or authorized representative who has indicated his/her understanding and acceptance.   Dental advisory given  Plan Discussed with: CRNA and Surgeon  Anesthesia Plan Comments: (Plan routine monitors, GETA)        Anesthesia Quick Evaluation

## 2015-11-01 NOTE — Brief Op Note (Signed)
11/01/2015  9:25 AM  PATIENT:  Brenda Schneider  45 y.o. female  PRE-OPERATIVE DIAGNOSIS:   Menorrhagia Fibroids  POST-OPERATIVE DIAGNOSIS:   Same  PROCEDURE:  Procedure(s) with comments: LAPAROSCOPIC ASSISTED VAGINAL HYSTERECTOMY WITH SALPINGECTOMY (Bilateral) - VAGINAL  SURGEON:  Surgeon(s) and Role:    * Dian Queen, MD - Primary    * Arvella Nigh, MD - Assisting  PHYSICIAN ASSISTANT:   ASSISTANTS: none   ANESTHESIA:   general  EBL:  Total I/O In: 1500 [I.V.:1500] Out: 200 [Urine:100; Blood:100]  BLOOD ADMINISTERED:none  DRAINS: Urinary Catheter (Foley)   LOCAL MEDICATIONS USED:  MARCAINE     SPECIMEN:  Source of Specimen:  uterus and cervix and tubes  DISPOSITION OF SPECIMEN:  PATHOLOGY  COUNTS:  YES  TOURNIQUET:  * No tourniquets in log *  DICTATION: .Other Dictation: Dictation Number [id number]  PLAN OF CARE: Admit for overnight observation  PATIENT DISPOSITION:  PACU - hemodynamically stable.   Delay start of Pharmacological VTE agent (>24hrs) due to surgical blood loss or risk of bleeding: not applicable

## 2015-11-01 NOTE — Op Note (Signed)
Brenda Schneider, Brenda Schneider              ACCOUNT NO.:  [id number]  MEDICAL RECORD NO.:  [id number]  LOCATION:  64                         FACILITY:  [facility]  PHYSICIAN:  Liberty Mikail Goostree, M.D.DATE OF BIRTH:  [birth year]  DATE OF PROCEDURE:  11/01/2015 DATE OF DISCHARGE:                              OPERATIVE REPORT   PREOPERATIVE DIAGNOSIS:  Menorrhagia and uterine fibroids.  POSTOPERATIVE DIAGNOSIS:  Menorrhagia and uterine fibroids.  PROCEDURE:  Laparoscopic-assisted vaginal hysterectomy and bilateral salpingectomy.  SURGEON:  Hajra Port L. Helane Rima, MD  ASSISTANT:  Darlyn Chamber, MD  EBL:  100 mL.  DRAINS:  Foley.  COMPLICATIONS:  None.  Uterus, cervix, and fallopian tubes sent to Pathology.  PROCEDURE IN DETAIL:  This is a 45 year old female with symptomatic fibroids.  She was consented about the risks associated with surgery which include risk of anesthesia, risk of bleeding requiring transfusion, risk of injury to internal organs, including bowel, bladder, and surrounding organs, risk of infection, and risk of pulmonary embolism and venous thromboembolism.  After she had been counseled in the office about her issue, was consented to proceed and she was taken to the operating room, intubated in the standard fashion. She was placed in the lithotomy position.  She was prepped and draped and a Foley catheter was inserted.  Attention was turned to the abdomen where a small infraumbilical incision was made and the Veress needle was inserted one time and pneumoperitoneum was performed.  After maximum pressure of 15 mmHg was obtained, the Veress needle was removed and an 11 mm trocar was inserted one time.  The scope was introduced through the sheath and the patient was gently placed in Trendelenburg position. Her upper abdomen appeared normal.  The bowel surfaces appeared normal. No area excessive of any bleeding was noted.  A 5 mm trocar was placed under direct visualization  suprapubically.  Exam of the pelvis revealed normal ovaries and enlarged uterus, which was approximately 10 weeks size with fibroids.  We then used the Atraumatic grasper to identify the fallopian tube on the right, placed the EnSeal just beneath the fallopian tube and then carried that all the way down to the round ligament.  We freed the tube from the ovary and we also placed the EnSeal across the mesovarian ligament and freed the ovary from the uterus.  This was done on the left side with excellent hemostasis.  We were careful to stay away from the ureter and with history of bilateral ureteral movement and peristalsis afterwards.  We then released the pneumoperitoneum, went down to the vagina, placed a weighted speculum in the vagina, made a circumferential incision around the cervix.  Then entered the posterior cul-de-sac using Mayo scissors and then entered the anterior cul-de-sac.  We then placed curved Heaney clamps across each pedicle on either side.  Each pedicle was carefully clamped, stayed snug beside the cervix and uterus and it was suture ligated using 0 Vicryl suture.  We walked our way up the broad ligament and again staying very snug against the cervix and the uterus.  The cervix was long and once we reached the level of fundus, the uterus was retroflexed and removed and the remainder of  the broad ligament had been clamped on either side.  Each pedicle was clamped, cut, and suture ligated using 0 Vicryl suture.  We then closed the cuff posteriorly using 0 Vicryl in a running locked stitch and then the cuff was closed completely using 0 Vicryl in a running locked stitch.  We then went back up to the abdomen, irrigated the pelvis.  Hemostasis was again noted.  It was very good and then we released the pneumoperitoneum, removed all instruments from the abdominal cavity and closed the incisions using interrupted 3-0 Vicryl. Pressure dressing was applied to both.  All sponge,  lap, and instrument counts were correct x2.  The patient was extubated and went to recovery room in stable condition.     Luda Charbonneau L. Helane Rima, M.D.     Nevin Bloodgood  D:  11/01/2015  T:  11/01/2015  Job:  [id number]

## 2015-11-01 NOTE — Progress Notes (Signed)
Patient without any discomfort  Afebrile VSS Urine output about 300 + clear urine  Abdomen is soft and nontender Bandages clean and dry  IMPRESSION: POD #0 Doing well  PLAN: Remove cath Advance diet Discharge home tomorrow

## 2015-11-02 DIAGNOSIS — N920 Excessive and frequent menstruation with regular cycle: Secondary | ICD-10-CM | POA: Diagnosis not present

## 2015-11-02 LAB — BASIC METABOLIC PANEL
Anion gap: 8 (ref 5–15)
BUN: 8 mg/dL (ref 6–20)
CHLORIDE: 106 mmol/L (ref 101–111)
CO2: 26 mmol/L (ref 22–32)
Calcium: 8.8 mg/dL — ABNORMAL LOW (ref 8.9–10.3)
Creatinine, Ser: 0.6 mg/dL (ref 0.44–1.00)
GFR calc Af Amer: >60 mL/min (ref >60)
GFR calc non Af Amer: >60 mL/min (ref >60)
GLUCOSE: 118 mg/dL — AB (ref 65–99)
POTASSIUM: 3.2 mmol/L — AB (ref 3.5–5.1)
Sodium: 140 mmol/L (ref 135–145)

## 2015-11-02 LAB — CBC
HEMATOCRIT: 31.1 % — AB (ref 36.0–46.0)
Hemoglobin: 9.9 g/dL — ABNORMAL LOW (ref 12.0–15.0)
MCH: 25 pg — AB (ref 26.0–34.0)
MCHC: 31.8 g/dL (ref 30.0–36.0)
MCV: 78.5 fL (ref 78.0–100.0)
Platelets: 284 10*3/uL (ref 150–400)
RBC: 3.96 MIL/uL (ref 3.87–5.11)
RDW: 14.7 % (ref 11.5–15.5)
WBC: 9.9 10*3/uL (ref 4.0–10.5)

## 2015-11-02 MED ORDER — IBUPROFEN 600 MG PO TABS: 600.0000 mg | ORAL_TABLET | Freq: Four times a day (QID) | ORAL | Status: DC | PRN

## 2015-11-02 MED ORDER — TRAMADOL HCL 50 MG PO TABS: 50.0000 mg | ORAL_TABLET | Freq: Four times a day (QID) | ORAL | Status: DC | PRN

## 2015-11-02 NOTE — Progress Notes (Signed)
Brenda Schneider to be D/C'd Home per MD order.  Discussed with the patient and all questions fully answered.  VSS, Skin clean, dry and intact without evidence of skin break down, no evidence of skin tears noted. IV catheter discontinued intact. Site without signs and symptoms of complications. Dressing and pressure applied.  An After Visit Summary was printed and given to the patient. Patient received prescription.  D/c education completed with patient/family including follow up instructions, medication list, d/c activities limitations if indicated, with other d/c instructions as indicated by MD - patient able to verbalize understanding, all questions fully answered.   Patient instructed to return to ED, call 911, or call MD for any changes in condition.   Patient escorted via [place], and D/C home via private auto.  [name] 11/02/2015 11:53 AM

## 2015-11-02 NOTE — Discharge Summary (Signed)
  Admission Diagnosis: Symptomatic Fibroid  Discharge Diagnosis: Same  Hospital Course: 45 year old female with symptomatic fibroid admitted for LAVH and bilateral salpingectomy. She had an uncomplicated surgery with an EBL of 100 cc  ON POD #1 she was ambulating, voiding, and tolerating regular diet. She had good pain control on Tramadol and ibuprofen.  BP 119/45 mmHg  Pulse 66  Temp(Src) 98.9 F (37.2 C) (Oral)  Resp 16  Ht 5\' 3"  (1.6 m)  Wt 88.905 kg (196 lb)  BMI 34.73 kg/m2  SpO2 100%  LMP 10/08/2015 (Exact Date) Abdomen is soft and non tender Incisions are clean dry and intact  Results for orders placed or performed during the hospital encounter of 11/01/15 (from the past 24 hour(s))  Basic metabolic panel     Status: Abnormal   Collection Time: 11/02/15  4:27 AM  Result Value Ref Range   Sodium 140 135 - 145 mmol/L   Potassium 3.2 (L) 3.5 - 5.1 mmol/L   Chloride 106 101 - 111 mmol/L   CO2 26 22 - 32 mmol/L   Glucose, Bld 118 (H) 65 - 99 mg/dL   BUN 8 6 - 20 mg/dL   Creatinine, Ser 0.60 0.44 - 1.00 mg/dL   Calcium 8.8 (L) 8.9 - 10.3 mg/dL   GFR calc non Af Amer >60 >60 mL/min   GFR calc Af Amer >60 >60 mL/min   Anion gap 8 5 - 15  CBC     Status: Abnormal   Collection Time: 11/02/15  4:27 AM  Result Value Ref Range   WBC 9.9 4.0 - 10.5 K/uL   RBC 3.96 3.87 - 5.11 MIL/uL   Hemoglobin 9.9 (L) 12.0 - 15.0 g/dL   HCT 31.1 (L) 36.0 - 46.0 %   MCV 78.5 78.0 - 100.0 fL   MCH 25.0 (L) 26.0 - 34.0 pg   MCHC 31.8 30.0 - 36.0 g/dL   RDW 14.7 11.5 - 15.5 %   Platelets 284 150 - 400 K/uL   Patient will be discharged home in good  Condition. She was given RX tramadol and Ibuprofen  She will follow up in 1 week Given strict discharge precautions.

## 2015-11-04 LAB — TYPE AND SCREEN
ABO/RH(D): A NEG
ANTIBODY SCREEN: POSITIVE
DAT, IgG: POSITIVE
Unit division: 0
Unit division: 0

## 2015-11-05 ENCOUNTER — Encounter (HOSPITAL_BASED_OUTPATIENT_CLINIC_OR_DEPARTMENT_OTHER): Payer: Self-pay | Admitting: Obstetrics and Gynecology

## 2017-08-10 ENCOUNTER — Other Ambulatory Visit: Payer: Self-pay

## 2017-08-10 MED ORDER — LEVOTHYROXINE SODIUM 175 MCG PO TABS: 175.0000 ug | ORAL_TABLET | Freq: Every day | ORAL | 2 refills | Status: DC

## 2017-08-17 ENCOUNTER — Other Ambulatory Visit: Payer: Self-pay

## 2017-08-17 MED ORDER — ERGOCALCIFEROL 1.25 MG (50000 UT) PO CAPS: 50000.0000 [IU] | ORAL_CAPSULE | ORAL | 3 refills | Status: DC

## 2017-09-02 ENCOUNTER — Other Ambulatory Visit: Payer: Self-pay

## 2017-09-02 MED ORDER — LEVOTHYROXINE SODIUM 175 MCG PO TABS: 175.0000 ug | ORAL_TABLET | Freq: Every day | ORAL | 5 refills | Status: DC

## 2017-09-03 ENCOUNTER — Other Ambulatory Visit: Payer: Self-pay

## 2017-09-03 MED ORDER — LEVOTHYROXINE SODIUM 175 MCG PO TABS: 175.0000 ug | ORAL_TABLET | Freq: Every day | ORAL | 5 refills | Status: DC

## 2017-10-20 ENCOUNTER — Other Ambulatory Visit: Payer: Self-pay | Admitting: Nurse Practitioner

## 2017-10-20 MED ORDER — HYDROCHLOROTHIAZIDE 25 MG PO TABS: 25.0000 mg | ORAL_TABLET | Freq: Every day | ORAL | 3 refills | Status: DC

## 2017-12-18 ENCOUNTER — Telehealth: Payer: Self-pay

## 2017-12-18 NOTE — Telephone Encounter (Signed)
For now I recommend she take OTC vitamin d 1000iu every day. Will check routine, fasting labs again after next visit. Will treat vitamin d deficiency as indicated at that time.

## 2017-12-18 NOTE — Telephone Encounter (Signed)
Called left vm that pt can take 1000IU vit D instead of rx for drisdol at this time.  dbs

## 2018-02-23 ENCOUNTER — Other Ambulatory Visit: Payer: Self-pay

## 2018-02-23 MED ORDER — HYDROCHLOROTHIAZIDE 25 MG PO TABS: 25.0000 mg | ORAL_TABLET | Freq: Every day | ORAL | 3 refills | Status: DC

## 2018-06-29 ENCOUNTER — Telehealth: Payer: Self-pay

## 2018-06-29 NOTE — Telephone Encounter (Signed)
lmom need appt for further refills

## 2018-11-09 ENCOUNTER — Ambulatory Visit (HOSPITAL_COMMUNITY)
Admission: EM | Admit: 2018-11-09 | Discharge: 2018-11-09 | Disposition: A | Discharge status: Home / Self Care | Payer: 59 | Attending: Family Medicine | Admitting: Family Medicine

## 2018-11-09 ENCOUNTER — Encounter (HOSPITAL_COMMUNITY): Payer: Self-pay

## 2018-11-09 ENCOUNTER — Other Ambulatory Visit: Payer: Self-pay

## 2018-11-09 DIAGNOSIS — R040 Epistaxis: Secondary | ICD-10-CM | POA: Diagnosis not present

## 2018-11-09 DIAGNOSIS — R030 Elevated blood-pressure reading, without diagnosis of hypertension: Secondary | ICD-10-CM

## 2018-11-09 NOTE — ED Triage Notes (Signed)
Nose bleed, started 2 hours ago, bleeding controlled, packing changed  2 x

## 2018-11-10 ENCOUNTER — Telehealth (HOSPITAL_COMMUNITY): Payer: Self-pay | Admitting: Family Medicine

## 2018-11-10 ENCOUNTER — Other Ambulatory Visit: Payer: Self-pay

## 2018-11-10 ENCOUNTER — Emergency Department (HOSPITAL_COMMUNITY)
Admission: EM | Admit: 2018-11-10 | Discharge: 2018-11-10 | Disposition: A | Discharge status: Home / Self Care | Payer: 59 | Attending: Emergency Medicine | Admitting: Emergency Medicine

## 2018-11-10 DIAGNOSIS — R040 Epistaxis: Secondary | ICD-10-CM | POA: Insufficient documentation

## 2018-11-10 DIAGNOSIS — Z79899 Other long term (current) drug therapy: Secondary | ICD-10-CM | POA: Insufficient documentation

## 2018-11-10 MED ORDER — ONDANSETRON 4 MG PO TBDP: 4.0000 mg | ORAL_TABLET | Freq: Three times a day (TID) | ORAL | 0 refills | Status: DC | PRN

## 2018-11-10 NOTE — ED Notes (Signed)
No active bleeding from the right nair.

## 2018-11-10 NOTE — Telephone Encounter (Signed)
To see ENT on 4/24. Still with slight nose bleed; feeling in throat. Requests something for nausea. Sent to pharmacy on file.

## 2018-11-10 NOTE — ED Provider Notes (Signed)
[facility]   [id number] 11/09/18 Arrival Time: 1656  ASSESSMENT & PLAN:  1. Epistaxis   2. Elevated blood pressure reading without diagnosis of hypertension    Intractable. Afrin and pressure without significant help. Rhino rocket placed in R nare without complication. Observed for 30 minutes. Bleeding controlled.  Follow-up Information    Melissa Montane, MD.   Specialty:  Otolaryngology Contact information: [place] [address] [phone]        Rozetta Nunnery, MD.   Specialty:  Otolaryngology Contact information: [address] [place] [address] [phone]          Increased BPs noted. Has been aware of previous slight elevation. No treatment. In the process of getting a new PCP. After ENT evaluation, she is welcome to f/u here for BP recheck and to discuss initiation of medication if indicated.  ED if nosebleed recurs with packing in place. Reviewed expectations re: course of current medical issues. Questions answered. Outlined signs and symptoms indicating need for more acute intervention. Patient verbalized understanding. After Visit Summary given.   SUBJECTIVE:  Brenda Schneider is a 48 y.o. female who reports abrupt onset of R-sided epistaxis today at work. Has been difficult to control with holding pressure to nose. H/O nosebleeds "but never this bad". Usually related to seasonal allergies in the past which have worsened recently. Does not take blood thinning medications. No recent illnesses. No facial injury/trauma. No recent CP, headaches, n/v reported. No respiratory symptoms or SOB. No LE edema. No OTC tx.  ROS: As per HPI. All other systems negative.    OBJECTIVE:  Vitals:   11/09/18 1739 11/09/18 1829  BP: (!) 194/93 (!) 179/96  Pulse: 82 85  Resp: 18   Temp: 98.7 F (37.1 C)   SpO2: 99%     General appearance: alert; no distress HEENT: ; AT; R-sided epistaxis that is fairly  perfuse; unable to see into nare secondary to active bleeding Neck: supple with FROM; no lymphadenopathy CV: RRR Lungs: clear to auscultation bilaterally Abd: soft; non-tender Skin: reveals no rash; warm and dry Neuro: normal gait Psychological: alert and cooperative; normal mood and affect  No Known Allergies  Past Medical History:  Diagnosis Date  . 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 Menorrhagia   . Uterine fibroid    Social History   Socioeconomic History  . Marital status: Married    Spouse name: Not on file  . Number of children: Not on file  . Years of education: Not on file  . Highest education level: Not on file  Occupational History  . Not on file  Social Needs  . Financial resource strain: Not on file  . Food insecurity:    Worry: Not on file    Inability: Not on file  . Transportation needs:    Medical: Not on file    Non-medical: Not on file  Tobacco Use  . Smoking status: Never Smoker  . Smokeless tobacco: Never Used  Substance and Sexual Activity  . Alcohol use: Yes    Comment: SOCIAL  . Drug use: No  . Sexual activity: Not on file  Lifestyle  . Physical activity:    Days per week: Not on file    Minutes per session: Not on file  . Stress: Not on file  Relationships  . Social connections:    Talks on phone: Not on file  Gets together: Not on file    Attends religious service: Not on file    Active member of club or organization: Not on file    Attends meetings of clubs or organizations: Not on file    Relationship status: Not on file  . Intimate partner violence:    Fear of current or ex partner: Not on file    Emotionally abused: Not on file    Physically abused: Not on file    Forced sexual activity: Not on file  Other Topics Concern  . Not on file  Social History Narrative  . Not on file   Family History  Problem Relation Age of Onset  . Cancer  Mother   . Thyroid disease Paternal Grandmother   . Heart disease Paternal Grandmother   . Diabetes Neg Hx           Vanessa Kick, MD 11/10/18 [phone]

## 2018-11-10 NOTE — ED Notes (Signed)
Rhino rocket removed by PA. Bleeding controlled

## 2018-11-10 NOTE — ED Provider Notes (Signed)
[facility] EMERGENCY DEPARTMENT Provider Note   CSN: [id number] Arrival date & time: 11/10/18  1333    History   Chief Complaint Chief Complaint  Patient presents with  . Epistaxis    HPI Brenda Schneider is a 48 y.o. female.     HPI  Patient is a 48 year old female with a past medical history of Hashimoto's thyroiditis, menorrhagia, recurrent epistaxis presenting for epistaxis.  Patient presented to urgent care yesterday and had Afrin treatments as well as anterior nasal packing.  Patient reports that she felt oozing around it in the posterior nasopharynx as well as anteriorly.  Patient reports that she has had facial pain over the past 24 hours secondary to this.  Patient reports is very uncomfortable and feels that she is continuing to bleed.  Patient does not take blood thinners.  Patient has previously taken Flonase, however she has not taken this therapy currently.  She does have a history of high blood pressure, however she is on hydrochlorothiazide presently.  Past Medical History:  Diagnosis Date  . 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 Menorrhagia   . Uterine fibroid     Patient Active Problem List   Diagnosis Date Noted  . S/P laparoscopic assisted vaginal hysterectomy (LAVH) 11/01/2015  . Acquired autoimmune hypothyroidism 06/05/2014    Past Surgical History:  Procedure Laterality Date  . HYSTEROSCOPY W/D&C  2007  . LAPAROSCOPIC CHOLECYSTECTOMY  Feb 1999  . LAPAROSCOPIC VAGINAL HYSTERECTOMY WITH SALPINGECTOMY Bilateral 11/01/2015   Procedure: LAPAROSCOPIC ASSISTED VAGINAL HYSTERECTOMY WITH SALPINGECTOMY;  Surgeon: Dian Queen, MD;  Location: [facility];  Service: Gynecology;  Laterality: Bilateral;  VAGINAL  . TONSILLECTOMY  1991  . TUBAL LIGATION  Mar 1999  . TYMPANOSTOMY TUBE PLACEMENT Bilateral 1980's     OB History   No obstetric  history on file.      Home Medications    Prior to Admission medications   Medication Sig Start Date End Date Taking? Authorizing Provider  ergocalciferol (DRISDOL) 50000 units capsule Take 1 capsule (50,000 Units total) by mouth once a week. 08/17/17   Ronnell Freshwater, NP  hydrochlorothiazide (HYDRODIURIL) 25 MG tablet Take 1 tablet (25 mg total) by mouth daily. 02/23/18   Ronnell Freshwater, NP  ibuprofen (ADVIL,MOTRIN) 600 MG tablet Take 1 tablet (600 mg total) by mouth every 6 (six) hours as needed (mild pain). 11/02/15   Dian Queen, MD  levothyroxine (SYNTHROID, LEVOTHROID) 175 MCG tablet Take 1 tablet (175 mcg total) by mouth daily before breakfast. 09/03/17   Ronnell Freshwater, NP  ondansetron (ZOFRAN-ODT) 4 MG disintegrating tablet Take 1 tablet (4 mg total) by mouth every 8 (eight) hours as needed for nausea or vomiting. 11/10/18   Vanessa Kick, MD  traMADol (ULTRAM) 50 MG tablet Take 1 tablet (50 mg total) by mouth every 6 (six) hours as needed for moderate pain. 11/02/15   Dian Queen, MD    Family History Family History  Problem Relation Age of Onset  . Cancer Mother   . Thyroid disease Paternal Grandmother   . Heart disease Paternal Grandmother   . Diabetes Neg Hx     Social History Social History   Tobacco Use  . Smoking status: Never Smoker  . Smokeless tobacco: Never Used  Substance Use Topics  . Alcohol use: Yes    Comment: SOCIAL  . Drug use: No     Allergies  Patient has no known allergies.   Review of Systems Review of Systems  Constitutional: Negative for chills and fever.  HENT: Positive for nosebleeds, sinus pressure and sinus pain. Negative for congestion, sore throat, trouble swallowing and voice change.   Respiratory: Negative for shortness of breath.   Gastrointestinal: Positive for nausea. Negative for vomiting.  Hematological: Does not bruise/bleed easily.  All other systems reviewed and are negative.    Physical Exam Updated  Vital Signs BP (!) 173/86   Pulse 66   Temp 98.1 F (36.7 C) (Oral)   Resp 17   Ht 5\' 3"  (1.6 m)   Wt 83.9 kg   LMP 10/08/2015 (Exact Date)   SpO2 99%   BMI 32.77 kg/m   Physical Exam Vitals signs and nursing note reviewed.  Constitutional:      General: She is not in acute distress.    Appearance: She is well-developed. She is not diaphoretic.     Comments: Sitting comfortably in bed.  HENT:     Head: Normocephalic and atraumatic.     Nose:     Comments: Nasal packing was removed.  Nasal mucosa bilateral nares pink.  There is a slightly engorged vessel on the right sided Kessel box plexus, but no active bleeding.  No crusted blood in nares.  No blood in the posterior pharynx.    Mouth/Throat:     Mouth: Mucous membranes are moist.  Eyes:     General:        Right eye: No discharge.        Left eye: No discharge.     Conjunctiva/sclera: Conjunctivae normal.     Comments: EOMs normal to gross examination.  Neck:     Musculoskeletal: Normal range of motion.  Cardiovascular:     Rate and Rhythm: Normal rate and regular rhythm.     Comments: Intact, 2+ radial pulse. Pulmonary:     Comments: Converses comfortably.  No audible wheeze or stridor. Abdominal:     General: There is no distension.  Musculoskeletal: Normal range of motion.  Skin:    General: Skin is warm and dry.  Neurological:     Mental Status: She is alert.     Comments: Cranial nerves intact to gross observation. Patient moves extremities without difficulty.  Psychiatric:        Behavior: Behavior normal.        Thought Content: Thought content normal.        Judgment: Judgment normal.      ED Treatments / Results  Labs (all labs ordered are listed, but only abnormal results are displayed) Labs Reviewed - No data to display  EKG None  Radiology No results found.  Procedures Procedures (including critical care time)  Medications Ordered in ED Medications - No data to display   Initial  Impression / Assessment and Plan / ED Course  I have reviewed the triage vital signs and the nursing notes.  Pertinent labs & imaging results that were available during my care of the patient were reviewed by me and considered in my medical decision making (see chart for details).        Patient is well-appearing, hemodynamically stable, and in no acute distress.  Patient presenting with nasal packing placed to urgent care.  This was removed, and there was no active bleeding upon removal.  Suspect that the nasal packing can become saturated over the past 24 hours, however it had successfully tamponaded the bleeding.  Patient ports that she  felt much better after packing removal.  The bilateral nares were lubricated and there were no further bleeds.  Patient felt comfortable with discharge without further intervention.  I encouraged the patient to avoid any intranasal treatment such as Flonase, and use humidified air to treat her symptoms of allergy.  Patient was given return precautions for any rebleeding.  Patient is in understanding and agrees with the plan of care.  Incidentally, blood pressure elevated today.  Patient previously taken off antihypertensives due to normotensive readings.  I encourage follow-up with primary care provider for recheck.  Final Clinical Impressions(s) / ED Diagnoses   Final diagnoses:  Epistaxis    ED Discharge Orders    None       Tamala Julian 11/10/18 [place], Kevin, MD 11/11/18 [phone]

## 2018-11-10 NOTE — ED Triage Notes (Signed)
Pt c/o right nostril bleeding since yesterday ; pt went to urgent care yesterday and got a rhino rocket place in nostril , today she noticed that blood was still coming out of the nostril and states she can still feel like " I'm swallowing the blood" pt was sent here from urgent care MD for further evaluation

## 2018-11-10 NOTE — Discharge Instructions (Signed)
Please see the information and instructions below regarding your visit.  Your diagnoses today include:  1. Epistaxis     Bleeding in the nose occurs for many reasons. Fortunately, the bleeding appears to be controlled and conservative measures can be tried if your nose bleeds again. Please read the instructions below carefully on what to do if your nose bleeds again.  Home care instructions:  Please follow any educational materials contained in this packet.   If your nose bleeds again:  Step 1: Blow your nose gently to remove any blood clots  Step 2: Apply FIRM, DIRECT PRESSURE to your NASAL BRIDGE (this is the flat part of your nose above your nostrils) for 5 minutes straight. Lean your head forward to help prevent blood from going down your throat.  Step 3: Remove pressure and see if your nose is still bleeding.  If it is, you can gently blow your nose again to remove any clots.  Step 4: Use the Afrin/oxymetazoline nasal spray that I have given you.  Spray 2 sprays into both nostrils.  Then, apply FIRM, DIRECT PRESSURE to your nasal bridge again for 5 minutes.   Step 5: If bleeding is not controlled, continue holding pressure and seek medical attention.  Use a small amount of vaseline in your nostrils to help moisturize. You can also use over-the-counter SALINE SPRAY to help prevent your nose bleeds. Overuse of nasal steroids can promote nosebleeds. Please refrain from using products like Flonase while your nose is healing and always ensure that you are pointing this medicine away from the nasal septum.  I recommend using humidified air instead.  Tests performed today include: See side panel of your discharge paperwork for testing performed today. Vital signs are listed at the bottom of these instructions.   Medications prescribed:    Take any prescribed medications only as prescribed, and any over the counter medications only as directed on the packaging.  Oxymetazoline (Afrin)  is also used for decongestion. If using it for any other purpose than stopping a nose bleed, please ensure that you do not use it more than 3 days in a row. Overuse can cause congestion that is very difficult to reverse.   Follow-up instructions: Please follow-up with your primary care provider for further evaluation of your blood pressure.  Please follow up with the ENT.  Return instructions:  Please return to the Emergency Department if you experience worsening symptoms.  Please return for any dizziness, lightheadedness, feeling like you are going to pass out, feeling that your airway is blocked or shortness of breath after recurrent nosebleeds. Please return if you have any other emergent concerns.  Additional Information:   Your vital signs today were: BP (!) 166/78    Pulse 71    Temp 98.1 F (36.7 C) (Oral)    Resp 17    Ht 5\' 3"  (1.6 m)    Wt 83.9 kg    LMP 10/08/2015 (Exact Date)    SpO2 100%    BMI 32.77 kg/m  If your blood pressure (BP) was elevated on multiple readings during this visit above 130 for the top number or above 80 for the bottom number, please have this repeated by your primary care provider within one month. --------------  Thank you for allowing [place] to participate in your care today.

## 2018-11-11 ENCOUNTER — Other Ambulatory Visit: Payer: Self-pay | Admitting: Nurse Practitioner

## 2018-11-12 ENCOUNTER — Other Ambulatory Visit: Payer: Self-pay

## 2018-11-12 ENCOUNTER — Telehealth: Payer: Self-pay

## 2018-11-12 DIAGNOSIS — J329 Chronic sinusitis, unspecified: Secondary | ICD-10-CM | POA: Diagnosis not present

## 2018-11-12 DIAGNOSIS — J343 Hypertrophy of nasal turbinates: Secondary | ICD-10-CM | POA: Diagnosis not present

## 2018-11-12 DIAGNOSIS — R040 Epistaxis: Secondary | ICD-10-CM | POA: Diagnosis not present

## 2018-11-12 NOTE — Telephone Encounter (Signed)
Spoke with pt need appt for  refiills she said she call next week last her 2018

## 2018-11-16 ENCOUNTER — Other Ambulatory Visit: Payer: Self-pay | Admitting: Otolaryngology

## 2018-11-16 DIAGNOSIS — J328 Other chronic sinusitis: Secondary | ICD-10-CM

## 2018-11-18 ENCOUNTER — Other Ambulatory Visit: Payer: Self-pay

## 2018-11-18 ENCOUNTER — Ambulatory Visit: Payer: 59 | Admitting: Nurse Practitioner

## 2018-11-18 ENCOUNTER — Encounter: Payer: Self-pay | Admitting: Nurse Practitioner

## 2018-11-18 VITALS — BP 164/87 | Ht 63.0 in | Wt 184.0 lb

## 2018-11-18 DIAGNOSIS — I10 Essential (primary) hypertension: Secondary | ICD-10-CM | POA: Insufficient documentation

## 2018-11-18 DIAGNOSIS — E063 Autoimmune thyroiditis: Secondary | ICD-10-CM | POA: Diagnosis not present

## 2018-11-18 DIAGNOSIS — R040 Epistaxis: Secondary | ICD-10-CM | POA: Insufficient documentation

## 2018-11-18 MED ORDER — HYDROCHLOROTHIAZIDE 25 MG PO TABS: 25.0000 mg | ORAL_TABLET | Freq: Every day | ORAL | 3 refills | Status: DC

## 2018-11-18 MED ORDER — LEVOTHYROXINE SODIUM 175 MCG PO TABS: 175.0000 ug | ORAL_TABLET | Freq: Every day | ORAL | 3 refills | Status: DC

## 2018-11-18 MED ORDER — AMLODIPINE BESYLATE 2.5 MG PO TABS: 2.5000 mg | ORAL_TABLET | Freq: Every day | ORAL | 2 refills | Status: DC

## 2018-11-18 NOTE — Progress Notes (Signed)
Pt blood pressure elevated, informed provider.   Pt blood pressure readings for the past few days  11/17/2018 Right arm 151/81 sitting Left arm 160/85 sitting   11/16/2018 Left arm 177/87 sitting Right arm 176/92  Before this episode pt blood pressure had returned to normal she stopped taking hydrochlorothiazide  Her blood pressure was reading 120's/81 per patient.

## 2018-11-18 NOTE — Progress Notes (Signed)
[facility] [place], [place] [address]  Internal MEDICINE  Telephone Visit  Patient Name: Brenda Schneider  [id number]  [id number]  Date of Service: 11/18/2018  I connected with the patient at 3:52pm by webcam and verified the patients identity using two identifiers.   I discussed the limitations, risks, security and privacy concerns of performing an evaluation and management service by webcam and the availability of in person appointments. I also discussed with the patient that there may be a patient responsible charge related to the service.  The patient expressed understanding and agrees to proceed.    Chief Complaint  Patient presents with  . Telephone Screen    VIDEO VISIT  . Telephone Assessment  . Medical Management of Chronic Issues    hospital follow up for nose bleed, blood pressure  was elevated  . Anxiety    pt has had some anxiety due to the nose bleeds    The patient has been contacted via webcam for follow up visit due to concerns for spread of novel coronavirus. The patient was recently seen in urgent care and in ER about 1 week ago. Bleeding was so severe she had rhino rocket inserted. She bled round the rhino rocket. Had to see ENT on emergent basis and have several vessels cauterized. Her blood pressure has been moderately elevated since the first nosebleed. She had been having some headaches prior to the nosebleed, but hadn't though much of it. She continues to take HCTZ 25mg  every morning. She denies chest pain, pressure, or shortness of breath. She did have some nausea when she had significant nose bleed, however, this has resolved .      Current Medication: Outpatient Encounter Medications as of 11/18/2018  Medication Sig  . levothyroxine (SYNTHROID) 175 MCG tablet Take 1 tablet (175 mcg total) by mouth daily before breakfast.  . [DISCONTINUED] levothyroxine (SYNTHROID, LEVOTHROID) 175 MCG tablet Take 1 tablet (175 mcg total) by mouth  daily before breakfast.  . amLODipine (NORVASC) 2.5 MG tablet Take 1 tablet (2.5 mg total) by mouth daily.  . ergocalciferol (DRISDOL) 50000 units capsule Take 1 capsule (50,000 Units total) by mouth once a week. (Patient not taking: Reported on 11/18/2018)  . hydrochlorothiazide (HYDRODIURIL) 25 MG tablet Take 1 tablet (25 mg total) by mouth daily.  Marland Kitchen ibuprofen (ADVIL,MOTRIN) 600 MG tablet Take 1 tablet (600 mg total) by mouth every 6 (six) hours as needed (mild pain). (Patient not taking: Reported on 11/18/2018)  . ondansetron (ZOFRAN-ODT) 4 MG disintegrating tablet Take 1 tablet (4 mg total) by mouth every 8 (eight) hours as needed for nausea or vomiting. (Patient not taking: Reported on 11/18/2018)  . traMADol (ULTRAM) 50 MG tablet Take 1 tablet (50 mg total) by mouth every 6 (six) hours as needed for moderate pain. (Patient not taking: Reported on 11/18/2018)  . [DISCONTINUED] hydrochlorothiazide (HYDRODIURIL) 25 MG tablet Take 1 tablet (25 mg total) by mouth daily. (Patient not taking: Reported on 11/18/2018)   No facility-administered encounter medications on file as of 11/18/2018.     Surgical History: Past Surgical History:  Procedure Laterality Date  . HYSTEROSCOPY W/D&C  2007  . LAPAROSCOPIC CHOLECYSTECTOMY  Feb 1999  . LAPAROSCOPIC VAGINAL HYSTERECTOMY WITH SALPINGECTOMY Bilateral 11/01/2015   Procedure: LAPAROSCOPIC ASSISTED VAGINAL HYSTERECTOMY WITH SALPINGECTOMY;  Surgeon: Dian Queen, MD;  Location: [facility];  Service: Gynecology;  Laterality: Bilateral;  VAGINAL  . TONSILLECTOMY  1991  . TUBAL LIGATION  Mar 1999  . TYMPANOSTOMY TUBE  PLACEMENT Bilateral 1980's    Medical History: Past Medical History:  Diagnosis Date  . 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 Menorrhagia   . Uterine fibroid     Family History: Family History  Problem Relation Age of Onset  . Cancer Mother    . Thyroid disease Paternal Grandmother   . Heart disease Paternal Grandmother   . Diabetes Neg Hx     Social History   Socioeconomic History  . Marital status: Married    Spouse name: Not on file  . Number of children: Not on file  . Years of education: Not on file  . Highest education level: Not on file  Occupational History  . Not on file  Social Needs  . Financial resource strain: Not on file  . Food insecurity:    Worry: Not on file    Inability: Not on file  . Transportation needs:    Medical: Not on file    Non-medical: Not on file  Tobacco Use  . Smoking status: Never Smoker  . Smokeless tobacco: Never Used  Substance and Sexual Activity  . Alcohol use: Yes    Comment: SOCIAL  . Drug use: No  . Sexual activity: Not on file  Lifestyle  . Physical activity:    Days per week: Not on file    Minutes per session: Not on file  . Stress: Not on file  Relationships  . Social connections:    Talks on phone: Not on file    Gets together: Not on file    Attends religious service: Not on file    Active member of club or organization: Not on file    Attends meetings of clubs or organizations: Not on file    Relationship status: Not on file  . Intimate partner violence:    Fear of current or ex partner: Not on file    Emotionally abused: Not on file    Physically abused: Not on file    Forced sexual activity: Not on file  Other Topics Concern  . Not on file  Social History Narrative  . Not on file      Review of Systems  Constitutional: Negative for chills, diaphoresis, fatigue and unexpected weight change.  HENT: Positive for nosebleeds. Negative for ear pain, postnasal drip and sinus pressure.   Respiratory: Negative for cough, shortness of breath and wheezing.   Cardiovascular: Negative for chest pain and palpitations.       Moderate elevation of blood pressure.   Gastrointestinal: Positive for nausea. Negative for abdominal pain, constipation, diarrhea  and vomiting.  Endocrine: Negative for cold intolerance, heat intolerance, polydipsia and polyuria.  Musculoskeletal: Negative for arthralgias, back pain, gait problem and neck pain.  Skin: Negative for color change.  Allergic/Immunologic: Negative for environmental allergies and food allergies.  Neurological: Negative for dizziness and headaches.  Hematological: Does not bruise/bleed easily.  Psychiatric/Behavioral: Negative for agitation, behavioral problems (depression) and hallucinations.    Today's Vitals   11/18/18 1543  BP: (!) 164/87  Weight: 184 lb (83.5 kg)  Height: 5\' 3"  (1.6 m)   Body mass index is 32.59 kg/m.  Observation/Objective:  The patient is alert and oriented. She is pleasant and answers all questions appropriately.. breathing is non-labored and she is in no acute distress.    Assessment/Plan: 1. Essential hypertension Add amlodipine 2.5mg tablets every evening. Continue HCTZ every monrning. Monitor blood pressures in morning and  before bed. Advised her to avoid excess salt in the diet and to participate in routine exercise.  - amLODipine (NORVASC) 2.5 MG tablet; Take 1 tablet (2.5 mg total) by mouth daily.  Dispense: 30 tablet; Refill: 2 - hydrochlorothiazide (HYDRODIURIL) 25 MG tablet; Take 1 tablet (25 mg total) by mouth daily.  Dispense: 30 tablet; Refill: 3  2. Acute posterior epistaxis Potentially related to higher blood pressures. Management as above. Follow up with ENT as scheduled.   3. Acquired autoimmune hypothyroidism Continue levothyroxine as prescribed. Will get check of thyroid panel at next visit.  - levothyroxine (SYNTHROID) 175 MCG tablet; Take 1 tablet (175 mcg total) by mouth daily before breakfast.  Dispense: 30 tablet; Refill: 3  General Counseling: Declyn verbalizes understanding of the findings of today's phone visit and agrees with plan of treatment. I have discussed any further diagnostic evaluation that may be needed or ordered today.  We also reviewed her medications today. she has been encouraged to call the office with any questions or concerns that should arise related to todays visit.  Hypertension Counseling:   The following hypertensive lifestyle modification were recommended and discussed:  1. Limiting alcohol intake to less than 1 oz/day of ethanol:(24 oz of beer or 8 oz of wine or 2 oz of 100-proof whiskey). 2. Take baby ASA 81 mg daily. 3. Importance of regular aerobic exercise and losing weight. 4. Reduce dietary saturated fat and cholesterol intake for overall cardiovascular health. 5. Maintaining adequate dietary potassium, calcium, and magnesium intake. 6. Regular monitoring of the blood pressure. 7. Reduce sodium intake to less than 100 mmol/day (less than 2.3 gm of sodium or less than 6 gm of sodium choride)   This patient was seen by [facility] with Dr Lavera Guise as a part of collaborative care agreement  Meds ordered this encounter  Medications  . amLODipine (NORVASC) 2.5 MG tablet    Sig: Take 1 tablet (2.5 mg total) by mouth daily.    Dispense:  30 tablet    Refill:  2    Order Specific Question:   Supervising Provider    Answer:   Lavera Guise [[id number]]  . hydrochlorothiazide (HYDRODIURIL) 25 MG tablet    Sig: Take 1 tablet (25 mg total) by mouth daily.    Dispense:  30 tablet    Refill:  3    Order Specific Question:   Supervising Provider    Answer:   Lavera Guise [[id number]]  . levothyroxine (SYNTHROID) 175 MCG tablet    Sig: Take 1 tablet (175 mcg total) by mouth daily before breakfast.    Dispense:  30 tablet    Refill:  3    Order Specific Question:   Supervising Provider    Answer:   Lavera Guise [[id number]]    Time spent: 60 Minutes    Dr Lavera Guise Internal medicine

## 2018-11-19 MED FILL — LEVOTHYROXINE SODIUM 175 MC: 175 | 30 days supply | Qty: 30 | Fill #0

## 2018-11-19 MED FILL — AMLODIPINE 2.5 MG TABLET: 2.5 | 30 days supply | Qty: 30 | Fill #0

## 2018-11-19 MED FILL — HYDROCHLOROTHIAZIDE 25 MG T: 25 | 30 days supply | Qty: 30 | Fill #0

## 2018-12-03 ENCOUNTER — Ambulatory Visit: Payer: 59 | Admitting: Nurse Practitioner

## 2018-12-06 ENCOUNTER — Encounter: Payer: Self-pay | Admitting: Nurse Practitioner

## 2018-12-06 ENCOUNTER — Other Ambulatory Visit: Payer: Self-pay

## 2018-12-06 ENCOUNTER — Ambulatory Visit (INDEPENDENT_AMBULATORY_CARE_PROVIDER_SITE_OTHER): Payer: 59 | Admitting: Nurse Practitioner

## 2018-12-06 VITALS — BP 122/70 | Wt 185.0 lb

## 2018-12-06 DIAGNOSIS — I10 Essential (primary) hypertension: Secondary | ICD-10-CM | POA: Diagnosis not present

## 2018-12-06 DIAGNOSIS — R040 Epistaxis: Secondary | ICD-10-CM | POA: Diagnosis not present

## 2018-12-06 NOTE — Progress Notes (Signed)
Pt checked bp last night 122/74.

## 2018-12-06 NOTE — Progress Notes (Signed)
[facility] [place], [place] [address]  Internal MEDICINE  Telephone Visit  Patient Name: Brenda Schneider  [id number]  [id number]  Date of Service: 12/21/2018  I connected with the patient at 4:44pm by telephone and verified the patients identity using two identifiers.   I discussed the limitations, risks, security and privacy concerns of performing an evaluation and management service by telephone and the availability of in person appointments. I also discussed with the patient that there may be a patient responsible charge related to the service.  The patient expressed understanding and agrees to proceed.    Chief Complaint  Patient presents with  . Telephone Assessment  . Telephone Screen  . Hypertension    added amlodipine    The patient has been contacted via telephone for follow up visit due to concerns for spread of novel coronavirus. Added amlodipine 2.5mg  daily after she had been treated for nosebleed, likely related to hypertension. Hydrochlorothiazide had not been controlling the blood pressure well on it's own. Blood pressure is much better today and she is tolerating the new medication well.       Current Medication: Outpatient Encounter Medications as of 12/06/2018  Medication Sig  . amLODipine (NORVASC) 2.5 MG tablet Take 1 tablet (2.5 mg total) by mouth daily.  . hydrochlorothiazide (HYDRODIURIL) 25 MG tablet Take 1 tablet (25 mg total) by mouth daily.  Marland Kitchen levothyroxine (SYNTHROID) 175 MCG tablet Take 1 tablet (175 mcg total) by mouth daily before breakfast.  . [DISCONTINUED] ergocalciferol (DRISDOL) 50000 units capsule Take 1 capsule (50,000 Units total) by mouth once a week. (Patient not taking: Reported on 12/06/2018)  . [DISCONTINUED] ibuprofen (ADVIL,MOTRIN) 600 MG tablet Take 1 tablet (600 mg total) by mouth every 6 (six) hours as needed (mild pain). (Patient not taking: Reported on 12/06/2018)  . [DISCONTINUED] ondansetron (ZOFRAN-ODT) 4 MG  disintegrating tablet Take 1 tablet (4 mg total) by mouth every 8 (eight) hours as needed for nausea or vomiting. (Patient not taking: Reported on 12/06/2018)  . [DISCONTINUED] traMADol (ULTRAM) 50 MG tablet Take 1 tablet (50 mg total) by mouth every 6 (six) hours as needed for moderate pain. (Patient not taking: Reported on 12/06/2018)   No facility-administered encounter medications on file as of 12/06/2018.     Surgical History: Past Surgical History:  Procedure Laterality Date  . HYSTEROSCOPY W/D&C  2007  . LAPAROSCOPIC CHOLECYSTECTOMY  Feb 1999  . LAPAROSCOPIC VAGINAL HYSTERECTOMY WITH SALPINGECTOMY Bilateral 11/01/2015   Procedure: LAPAROSCOPIC ASSISTED VAGINAL HYSTERECTOMY WITH SALPINGECTOMY;  Surgeon: Dian Queen, MD;  Location: [facility];  Service: Gynecology;  Laterality: Bilateral;  VAGINAL  . TONSILLECTOMY  1991  . TUBAL LIGATION  Mar 1999  . TYMPANOSTOMY TUBE PLACEMENT Bilateral 1980's    Medical History: Past Medical History:  Diagnosis Date  . 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 Menorrhagia   . Uterine fibroid     Family History: Family History  Problem Relation Age of Onset  . Cancer Mother   . Thyroid disease Paternal Grandmother   . Heart disease Paternal Grandmother   . Diabetes Neg Hx     Social History   Socioeconomic History  . Marital status: Married    Spouse name: Not on file  . Number of children: Not on file  . Years of education: Not on file  . Highest education level: Not on file  Occupational History  . Not on  file  Social Needs  . Financial resource strain: Not on file  . Food insecurity:    Worry: Not on file    Inability: Not on file  . Transportation needs:    Medical: Not on file    Non-medical: Not on file  Tobacco Use  . Smoking status: Never Smoker  . Smokeless tobacco: Never Used  Substance and Sexual Activity  . Alcohol  use: Never    Frequency: Never  . Drug use: No  . Sexual activity: Not on file  Lifestyle  . Physical activity:    Days per week: Not on file    Minutes per session: Not on file  . Stress: Not on file  Relationships  . Social connections:    Talks on phone: Not on file    Gets together: Not on file    Attends religious service: Not on file    Active member of club or organization: Not on file    Attends meetings of clubs or organizations: Not on file    Relationship status: Not on file  . Intimate partner violence:    Fear of current or ex partner: Not on file    Emotionally abused: Not on file    Physically abused: Not on file    Forced sexual activity: Not on file  Other Topics Concern  . Not on file  Social History Narrative  . Not on file      Review of Systems  Constitutional: Negative for chills, diaphoresis, fatigue and unexpected weight change.  HENT: Negative for ear pain, nosebleeds, postnasal drip and sinus pressure.   Respiratory: Negative for cough, shortness of breath and wheezing.   Cardiovascular: Negative for chest pain and palpitations.       Blood pressure is much improved   Gastrointestinal: Negative for abdominal pain, constipation, diarrhea, nausea and vomiting.  Endocrine: Negative for cold intolerance, heat intolerance, polydipsia and polyuria.  Musculoskeletal: Negative for arthralgias, back pain, gait problem and neck pain.  Skin: Negative for color change.  Allergic/Immunologic: Negative for environmental allergies and food allergies.  Neurological: Negative for dizziness and headaches.  Hematological: Does not bruise/bleed easily.  Psychiatric/Behavioral: Negative for agitation, behavioral problems (depression) and hallucinations.    Today's Vitals   12/06/18 1607  BP: 122/70  Weight: 185 lb (83.9 kg)   Body mass index is 32.77 kg/m.  Observation/Objective:   The patient is alert and oriented. She is pleasant and answers all  questions appropriately. Breathing is non-labored. She is in no acute distress at this time.    Assessment/Plan: 1. Essential hypertension Much improved. Continue amlodipine 2.5mg  daily. Continue HCTZ as previously prexcribed   2. Acute posterior epistaxis Resolved. No further issues since adding amlodipine and blood pressure better controlled.   General Counseling: Brenda Schneider verbalizes understanding of the findings of today's phone visit and agrees with plan of treatment. I have discussed any further diagnostic evaluation that may be needed or ordered today. We also reviewed her medications today. she has been encouraged to call the office with any questions or concerns that should arise related to todays visit.   This patient was seen by Leretha Pol FNP [facility] with Dr Lavera Guise as a part of collaborative care agreement  Time spent: 58 Minutes    Dr Lavera Guise Internal medicine

## 2018-12-16 MED FILL — HYDROCHLOROTHIAZIDE 25 MG T: 25 | 30 days supply | Qty: 30 | Fill #1

## 2018-12-16 MED FILL — LEVOTHYROXINE SODIUM 175 MC: 175 | 30 days supply | Qty: 30 | Fill #1

## 2018-12-16 MED FILL — AMLODIPINE 2.5 MG TABLET: 2.5 | 30 days supply | Qty: 30 | Fill #1

## 2018-12-30 ENCOUNTER — Ambulatory Visit: Payer: 59

## 2019-01-21 MED FILL — AMLODIPINE 2.5 MG TABLET: 2.5 | 30 days supply | Qty: 30 | Fill #2

## 2019-01-21 MED FILL — HYDROCHLOROTHIAZIDE 25 MG T: 25 | 30 days supply | Qty: 30 | Fill #2

## 2019-01-21 MED FILL — LEVOTHYROXINE SODIUM 175 MC: 175 | 30 days supply | Qty: 30 | Fill #2

## 2019-02-21 MED FILL — LEVOTHYROXINE SODIUM 175 MC: 175 | 30 days supply | Qty: 30 | Fill #3

## 2019-02-21 MED FILL — HYDROCHLOROTHIAZIDE 25 MG T: 25 | 30 days supply | Qty: 30 | Fill #3

## 2019-03-03 ENCOUNTER — Other Ambulatory Visit: Payer: Self-pay

## 2019-03-03 DIAGNOSIS — I10 Essential (primary) hypertension: Secondary | ICD-10-CM

## 2019-03-03 MED ORDER — AMLODIPINE BESYLATE 2.5 MG PO TABS: 2.5000 mg | ORAL_TABLET | Freq: Every day | ORAL | 0 refills | Status: DC

## 2019-03-03 MED FILL — AMLODIPINE 2.5 MG TABLET: 2.5 | 30 days supply | Qty: 30 | Fill #0

## 2019-03-29 ENCOUNTER — Other Ambulatory Visit: Payer: Self-pay

## 2019-03-29 DIAGNOSIS — E063 Autoimmune thyroiditis: Secondary | ICD-10-CM

## 2019-03-29 DIAGNOSIS — I10 Essential (primary) hypertension: Secondary | ICD-10-CM

## 2019-03-29 MED ORDER — HYDROCHLOROTHIAZIDE 25 MG PO TABS: 25.0000 mg | ORAL_TABLET | Freq: Every day | ORAL | 2 refills | Status: DC

## 2019-03-29 MED ORDER — LEVOTHYROXINE SODIUM 175 MCG PO TABS
175.0000 ug | ORAL_TABLET | Freq: Every day | ORAL | 2 refills | Status: DC
Start: 2019-03-29 — End: 2019-07-24

## 2019-07-14 ENCOUNTER — Telehealth: Payer: Self-pay

## 2019-07-14 NOTE — Telephone Encounter (Signed)
Called lmom informing patient of appointment. klh 

## 2019-07-19 ENCOUNTER — Ambulatory Visit (INDEPENDENT_AMBULATORY_CARE_PROVIDER_SITE_OTHER): Payer: 59 | Admitting: Nurse Practitioner

## 2019-07-19 ENCOUNTER — Other Ambulatory Visit: Payer: Self-pay

## 2019-07-19 ENCOUNTER — Encounter: Payer: Self-pay | Admitting: Nurse Practitioner

## 2019-07-19 VITALS — BP 153/76 | HR 67 | Temp 98.6°F | Resp 16 | Ht 63.0 in | Wt 177.4 lb

## 2019-07-19 DIAGNOSIS — E042 Nontoxic multinodular goiter: Secondary | ICD-10-CM | POA: Diagnosis not present

## 2019-07-19 DIAGNOSIS — I10 Essential (primary) hypertension: Secondary | ICD-10-CM | POA: Diagnosis not present

## 2019-07-19 DIAGNOSIS — N959 Unspecified menopausal and perimenopausal disorder: Secondary | ICD-10-CM | POA: Diagnosis not present

## 2019-07-19 DIAGNOSIS — F411 Generalized anxiety disorder: Secondary | ICD-10-CM

## 2019-07-19 MED ORDER — HYDROCHLOROTHIAZIDE 25 MG PO TABS: 25.0000 mg | ORAL_TABLET | Freq: Every day | ORAL | 2 refills | Status: DC

## 2019-07-19 MED ORDER — AMLODIPINE BESYLATE 2.5 MG PO TABS: ORAL_TABLET | ORAL | 2 refills | Status: DC

## 2019-07-19 MED ORDER — ALPRAZOLAM 0.25 MG PO TABS: ORAL_TABLET | ORAL | 1 refills | Status: DC

## 2019-07-19 NOTE — Progress Notes (Signed)
[facility] [place], [place] [address]  Internal MEDICINE  Office Visit Note  Patient Name: Brenda Schneider  [id number]  [id number]  Date of Service: 07/27/2019   Pt is here for a sick visit.  Chief Complaint  Patient presents with  . Anxiety    feeling emotional, intolerance to hot and cold, feels like how she felt when she first was dx with hyopthyroidism, clearing throat more frequently, change in voice; manufacturer of levothyroxine has changed, dizziness, trouble sleeping   . Weight Gain  . Headache     The patient is here for acute visit. States that she feels very tired, will have periods where she feels very anxious and shaky, then turns around and gets very fatigued. Nose bleeds have returned. She does feel much more stressed than usual. There are increased expectations at work and at home. She denies chest pain, pressure, or palpitations. She has had a few headaches off and on. Feels like the thyroid nodule on left side of thyroid has become larger. She has had a seven pound weight gain over last few months. Also feeling very emotional. Will cry without provocation or unusual events happening to make her feel overwhelmed. She states that she felt like this when she was first diagnosed with thyroid disease.        Current Medication:  Outpatient Encounter Medications as of 07/19/2019  Medication Sig  . amLODipine (NORVASC) 2.5 MG tablet Take 1.5 tablets po QD  . hydrochlorothiazide (HYDRODIURIL) 25 MG tablet Take 1 tablet (25 mg total) by mouth daily.  Marland Kitchen oxymetazoline (AFRIN) 0.05 % nasal spray Place 1 spray into both nostrils as needed for congestion.  . [DISCONTINUED] amLODipine (NORVASC) 2.5 MG tablet Take 1 tablet (2.5 mg total) by mouth daily.  . [DISCONTINUED] hydrochlorothiazide (HYDRODIURIL) 25 MG tablet Take 1 tablet (25 mg total) by mouth daily.  . [DISCONTINUED] levothyroxine (SYNTHROID) 175 MCG tablet Take 1 tablet (175 mcg total) by  mouth daily before breakfast.  . ALPRAZolam (XANAX) 0.25 MG tablet Take 1 tablet po BID prn anxiety   No facility-administered encounter medications on file as of 07/19/2019.      Medical History: Past Medical History:  Diagnosis Date  . 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 Menorrhagia   . Uterine fibroid    Today's Vitals   07/19/19 1526  BP: (!) 153/76  Pulse: 67  Resp: 16  Temp: 98.6 F (37 C)  SpO2: 97%  Weight: 177 lb 6.4 oz (80.5 kg)  Height: 5\' 3"  (1.6 m)   Body mass index is 31.42 kg/m.  Review of Systems  Constitutional: Positive for activity change, fatigue and unexpected weight change. Negative for chills.  HENT: Negative for congestion, postnasal drip, rhinorrhea, sneezing and sore throat.   Respiratory: Negative for cough, chest tightness, shortness of breath and wheezing.   Cardiovascular: Negative for chest pain and palpitations.       Elevated blood pressure  Gastrointestinal: Negative for abdominal pain, constipation, diarrhea, nausea and vomiting.  Endocrine: Negative for cold intolerance, heat intolerance, polydipsia and polyuria.       History of autoimmune thyroiditis.   Musculoskeletal: Negative for arthralgias, back pain, joint swelling and neck pain.  Skin: Negative for rash.  Allergic/Immunologic: Negative for environmental allergies.  Neurological: Positive for headaches. Negative for dizziness, tremors and numbness.  Hematological: Negative for adenopathy. Does not bruise/bleed easily.  Psychiatric/Behavioral: Positive for dysphoric mood  and sleep disturbance. Negative for behavioral problems (Depression) and suicidal ideas. The patient is nervous/anxious.     Physical Exam Vitals and nursing note reviewed.  Constitutional:      General: She is not in acute distress.    Appearance: Normal appearance. She is well-developed. She is not diaphoretic.  HENT:      Head: Normocephalic and atraumatic.     Nose: Nose normal.     Mouth/Throat:     Pharynx: No oropharyngeal exudate.  Eyes:     Pupils: Pupils are equal, round, and reactive to light.  Neck:     Thyroid: Thyromegaly and thyroid tenderness present.     Vascular: No JVD.     Trachea: No tracheal deviation.  Cardiovascular:     Rate and Rhythm: Normal rate and regular rhythm.     Heart sounds: Normal heart sounds. No murmur. No friction rub. No gallop.   Pulmonary:     Effort: Pulmonary effort is normal. No respiratory distress.     Breath sounds: Normal breath sounds. No wheezing or rales.  Chest:     Chest wall: No tenderness.  Abdominal:     Palpations: Abdomen is soft.  Musculoskeletal:        General: Normal range of motion.     Cervical back: Normal range of motion and neck supple.  Lymphadenopathy:     Cervical: No cervical adenopathy.  Skin:    General: Skin is warm and dry.  Neurological:     Mental Status: She is alert and oriented to person, place, and time.     Cranial Nerves: No cranial nerve deficit.  Psychiatric:        Behavior: Behavior normal.        Thought Content: Thought content normal.        Judgment: Judgment normal.   Assessment/Plan: 1. Essential hypertension Mildly elevated today. Increase norvasc 2.5mg  tablets to 1.5 tablets daily. conitnue HCTZ at 25mg  every day.  - amLODipine (NORVASC) 2.5 MG tablet; Take 1.5 tablets po QD  Dispense: 45 tablet; Refill: 2 - hydrochlorothiazide (HYDRODIURIL) 25 MG tablet; Take 1 tablet (25 mg total) by mouth daily.  Dispense: 30 tablet; Refill: 2  2. Multinodular thyroid Check full thyroid panel. Get new thyroid ultrasound for further evaluation.  - US THYROID; Future  3. Unspecified menopausal and perimenopausal disorder Check reproductive hormones  4. Generalized anxiety disorder Add alprazolam 0.25mg  tablets up to twice daily as needed for acute anxiety. Prescription for #45 tablets sent to her pharmacy  today. - ALPRAZolam (XANAX) 0.25 MG tablet; Take 1 tablet po BID prn anxiety  Dispense: 45 tablet; Refill: 1  General Counseling: [place] verbalizes understanding of the findings of todays visit and agrees with plan of treatment. I have discussed any further diagnostic evaluation that may be needed or ordered today. We also reviewed her medications today. she has been encouraged to call the office with any questions or concerns that should arise related to todays visit.    Counseling:  Hypertension Counseling:   The following hypertensive lifestyle modification were recommended and discussed:  1. Limiting alcohol intake to less than 1 oz/day of ethanol:(24 oz of beer or 8 oz of wine or 2 oz of 100-proof whiskey). 2. Take baby ASA 81 mg daily. 3. Importance of regular aerobic exercise and losing weight. 4. Reduce dietary saturated fat and cholesterol intake for overall cardiovascular health. 5. Maintaining adequate dietary potassium, calcium, and magnesium intake. 6. Regular monitoring of the blood pressure. 7.  Reduce sodium intake to less than 100 mmol/day (less than 2.3 gm of sodium or less than 6 gm of sodium choride)   This patient was seen by [facility] with Dr Lavera Guise as a part of collaborative care agreement  Orders Placed This Encounter  Procedures  . US THYROID    Meds ordered this encounter  Medications  . amLODipine (NORVASC) 2.5 MG tablet    Sig: Take 1.5 tablets po QD    Dispense:  45 tablet    Refill:  2    Please note change in dosing    Order Specific Question:   Supervising Provider    Answer:   Lavera Guise [place]  . hydrochlorothiazide (HYDRODIURIL) 25 MG tablet    Sig: Take 1 tablet (25 mg total) by mouth daily.    Dispense:  30 tablet    Refill:  2    Order Specific Question:   Supervising Provider    Answer:   Lavera Guise [id number]  . ALPRAZolam (XANAX) 0.25 MG tablet    Sig: Take 1 tablet po BID prn anxiety    Dispense:  45  tablet    Refill:  1    Order Specific Question:   Supervising Provider    Answer:   Lavera Guise [id number]    Total time spent: 30 Minutes

## 2019-07-20 ENCOUNTER — Other Ambulatory Visit: Payer: Self-pay | Admitting: Nurse Practitioner

## 2019-07-20 DIAGNOSIS — N959 Unspecified menopausal and perimenopausal disorder: Secondary | ICD-10-CM | POA: Diagnosis not present

## 2019-07-20 DIAGNOSIS — E042 Nontoxic multinodular goiter: Secondary | ICD-10-CM | POA: Diagnosis not present

## 2019-07-20 DIAGNOSIS — I10 Essential (primary) hypertension: Secondary | ICD-10-CM | POA: Diagnosis not present

## 2019-07-20 DIAGNOSIS — E039 Hypothyroidism, unspecified: Secondary | ICD-10-CM | POA: Diagnosis not present

## 2019-07-21 ENCOUNTER — Encounter: Payer: Self-pay | Admitting: Nurse Practitioner

## 2019-07-21 LAB — CBC
Hematocrit: 40.5 % (ref 34.0–46.6)
Hemoglobin: 13.5 g/dL (ref 11.1–15.9)
MCH: 27 pg (ref 26.6–33.0)
MCHC: 33.3 g/dL (ref 31.5–35.7)
MCV: 81 fL (ref 79–97)
Platelets: 306 10*3/uL (ref 150–450)
RBC: 5 x10E6/uL (ref 3.77–5.28)
RDW: 13.8 % (ref 11.7–15.4)
WBC: 7.9 10*3/uL (ref 3.4–10.8)

## 2019-07-21 LAB — COMPREHENSIVE METABOLIC PANEL
ALT: 25 IU/L (ref 0–32)
AST: 18 IU/L (ref 0–40)
Albumin/Globulin Ratio: 1.7 (ref 1.2–2.2)
Albumin: 4.7 g/dL (ref 3.8–4.8)
Alkaline Phosphatase: 87 IU/L (ref 39–117)
BUN/Creatinine Ratio: 18 (ref 9–23)
BUN: 11 mg/dL (ref 6–24)
Bilirubin Total: 0.5 mg/dL (ref 0.0–1.2)
CO2: 24 mmol/L (ref 20–29)
Calcium: 9.7 mg/dL (ref 8.7–10.2)
Chloride: 101 mmol/L (ref 96–106)
Creatinine, Ser: 0.62 mg/dL (ref 0.57–1.00)
GFR calc Af Amer: 125 mL/min/{1.73_m2} (ref >59)
GFR calc non Af Amer: 108 mL/min/{1.73_m2} (ref >59)
Globulin, Total: 2.7 g/dL (ref 1.5–4.5)
Glucose: 100 mg/dL — ABNORMAL HIGH (ref 65–99)
Potassium: 3.9 mmol/L (ref 3.5–5.2)
Sodium: 141 mmol/L (ref 134–144)
Total Protein: 7.4 g/dL (ref 6.0–8.5)

## 2019-07-21 LAB — T4, FREE: Free T4: 1.54 ng/dL (ref 0.82–1.77)

## 2019-07-21 LAB — FSH/LH
FSH: 7.7 m[IU]/mL
LH: 8.7 m[IU]/mL

## 2019-07-21 LAB — TSH: TSH: 1.5 u[IU]/mL (ref 0.450–4.500)

## 2019-07-21 LAB — THYROID PEROXIDASE ANTIBODY: Thyroperoxidase Ab SerPl-aCnc: 459 IU/mL — ABNORMAL HIGH (ref 0–34)

## 2019-07-21 LAB — THYROGLOBULIN ANTIBODY: Thyroglobulin Antibody: 4.4 IU/mL — ABNORMAL HIGH (ref 0.0–0.9)

## 2019-07-21 LAB — ESTRADIOL: Estradiol: 172 pg/mL

## 2019-07-23 NOTE — Progress Notes (Signed)
Normal TSH and Free T4, but thyroid auto antibodies are elevated. I would keep levothyroxine dose the same, but it will be important to see results of thyroid ultrasound. I will likely refer to endocrinology for further evaluation.

## 2019-07-24 ENCOUNTER — Other Ambulatory Visit: Payer: Self-pay | Admitting: Nurse Practitioner

## 2019-07-24 ENCOUNTER — Encounter: Payer: Self-pay | Admitting: Nurse Practitioner

## 2019-07-24 DIAGNOSIS — E063 Autoimmune thyroiditis: Secondary | ICD-10-CM

## 2019-07-24 MED ORDER — LEVOTHYROXINE SODIUM 175 MCG PO TABS: 175.0000 ug | ORAL_TABLET | Freq: Every day | ORAL | 2 refills | Status: DC

## 2019-07-24 NOTE — Progress Notes (Signed)
Renewed prescription for levothyroxine 130mcg daily and sent to [facility] pharmacy.

## 2019-07-25 ENCOUNTER — Ambulatory Visit (HOSPITAL_COMMUNITY)
Admission: RE | Admit: 2019-07-25 | Discharge: 2019-07-25 | Disposition: A | Discharge status: Home / Self Care | Payer: 59 | Source: Ambulatory Visit | Attending: Nurse Practitioner | Admitting: Nurse Practitioner

## 2019-07-25 ENCOUNTER — Other Ambulatory Visit: Payer: Self-pay

## 2019-07-25 DIAGNOSIS — E042 Nontoxic multinodular goiter: Secondary | ICD-10-CM | POA: Insufficient documentation

## 2019-07-25 DIAGNOSIS — E041 Nontoxic single thyroid nodule: Secondary | ICD-10-CM | POA: Diagnosis not present

## 2019-07-25 NOTE — Progress Notes (Signed)
Discuss at follow up visit 07/28/2019. Refer to endocrinology.

## 2019-07-26 ENCOUNTER — Encounter: Payer: Self-pay | Admitting: Internal Medicine

## 2019-07-26 ENCOUNTER — Ambulatory Visit (INDEPENDENT_AMBULATORY_CARE_PROVIDER_SITE_OTHER): Payer: 59 | Admitting: Internal Medicine

## 2019-07-26 ENCOUNTER — Telehealth: Payer: Self-pay

## 2019-07-26 VITALS — BP 140/70 | HR 90 | Ht 63.0 in | Wt 170.0 lb

## 2019-07-26 DIAGNOSIS — E041 Nontoxic single thyroid nodule: Secondary | ICD-10-CM | POA: Diagnosis not present

## 2019-07-26 DIAGNOSIS — E038 Other specified hypothyroidism: Secondary | ICD-10-CM | POA: Diagnosis not present

## 2019-07-26 DIAGNOSIS — E063 Autoimmune thyroiditis: Secondary | ICD-10-CM

## 2019-07-26 NOTE — Telephone Encounter (Signed)
Called lmom informing patient of appointment. klh 

## 2019-07-26 NOTE — Patient Instructions (Addendum)
Please continue Levothyroxine 175 mcg daily.  Take the thyroid hormone every day, with water, at least 30 minutes before breakfast, separated by at least 4 hours from: - acid reflux medications - calcium - iron - multivitamins  Please start Selenium 200 mcg daily.  Let's repeat the antibody titer in 3-4 months.  Please come back for a follow-up appointment in 1 year.

## 2019-07-26 NOTE — Progress Notes (Signed)
Patient ID: Brenda Schneider, female   DOB: June 13, 1971, 49 y.o.   MRN: [id number]   This visit occurred during the SARS-CoV-2 public health emergency.  Safety protocols were in place, including screening questions prior to the visit, additional usage of staff PPE, and extensive cleaning of exam room while observing appropriate contact time as indicated for disinfecting solutions.   HPI  Brenda Schneider is a 49 y.o.-year-old female, self-referred, for evaluation for Hashimoto's hypothyroidism and a thyroid nodule.  Hashimoto's hypothyroidism: - dx'ed 2009  In last 2 months,  in relationship with increased stress at work, she developed increased anxiety, weight gain (after initially losing weight intentionally), lack of sleep, fatigue, irritability, hot flashes alternating with cold intolerance.  TFTs were checked and they were normal.  Pt is on levothyroxine 175 mcg daily, taken: - in am - fasting - at least 60 min from b'fast - no Ca, Fe, MVI, PPIs - not on Biotin  I reviewed pt's thyroid tests: Lab Results  Component Value Date   TSH 1.500 07/20/2019   TSH 2.32 08/23/2014   TSH 4.17 06/02/2014   TSH 8.30 (A) 04/04/2014   TSH 10.00 (A) 01/07/2013   FREET4 1.54 07/20/2019   FREET4 1.07 08/23/2014   FREET4 0.95 06/02/2014    Her thyroid antibodies were also checked approximately a week ago and they were much higher than before: Component     Latest Ref Rng & Units 07/20/2019  Thyroglobulin Antibody     0.0 - 0.9 IU/mL 4.4 (H)  Thyroperoxidase Ab SerPl-aCnc     0 - 34 IU/mL 459 (H)  04/05/2015: TPO antibodies 228 (0-34), ATA antibodies 3.8 (0-0.9)  Thyroid nodule: - dx'ed 2015 - Bx was attempted in in 2015 but it was not done due to the pseudonodular appearance - However, recently, she feels that the nodule has recently increased and she also has hoarseness and feels the need to clear her throat more frequently recently. She also slight pressure in her neck.  Pt denies: -  dysphagia - choking - SOB with lying down  Thyroid U/S (02/13/2014):    Attempted nodule biopsy (04/06/2015): Diffusely heterogeneous and lobulated thyroid gland. The appearance is most consistent with chronic thyroid hormone replacement therapy versus chronic thyroiditis.  There is a rounded hyperechoic focus measuring 10 x 7 x 8 mm in the mid aspect of the gland as well as scattered small 2-3 mm hyperechoic foci throughout the gland. I favor these to represent pseudo nodules. If the echogenic focus is in fact a true nodule it has benign imaging features and is below the size threshold for biopsy.   Thyroid U/S (07/25/2019): Parenchymal Echotexture: Markedly heterogenous Isthmus: 0.3 cm Right lobe: 4.8 x 2.5 x 2.4 cm Left lobe: 3.9 x 1.9 x 1.5 cm _________________________________________________________  Nodule # 1: Prior biopsy: No Location: Right; Mid Maximum size: 1.7 cm; Other 2 dimensions: 1.0 x 1.3 cm, previously, 1.0 x 0.8 x 0.7 cm Composition: solid/almost completely solid (2) Echogenicity: hyperechoic (1) ACR TI-RADS total points: 3. ACR TI-RADS risk category:  TR3 (3 points). Significant change in size (>/= 20% in two dimensions and minimal increase of 2 mm): Yes Change in features: No Change in ACR TI-RADS risk category: No ACR TI-RADS recommendations: *Given size (>/= 1.5 - 2.4 cm) and appearance, a follow-up ultrasound in 1 year should be considered based on TI-RADS criteria. _________________________________________________________  IMPRESSION: Solitary TI-RADS category 3 nodule in the right mid gland demonstrates slow growth but no significant interval change in appearance  or morphology over the past 5 years. The nodule currently meets criteria for annual surveillance. Recommend follow-up ultrasound in 1 year.  Diffusely heterogeneous, lobular and enlarged background thyroid gland. Differential considerations include chronic thyroiditis and diffuse  goitrous change.       + FH of thyroid ds.: PGM. No FH of thyroid cancer. No h/o radiation tx to head or neck.  No recent contrast studies. No steroid use. No herbal supplements. No Biotin supplements or Hair, Skin and Nails vitamins.  She also has a history of HTN.  ROS: Constitutional: + See HPI  Eyes: + Blurry vision, no xerophthalmia ENT: no sore throat,  + see HPI Cardiovascular: no CP/SOB/palpitations/leg swelling Respiratory: no cough/SOB Gastrointestinal: no N/V/D/C Musculoskeletal: no muscle/joint aches Skin: no rashes, + hair loss Neurological: no tremors/numbness/tingling/dizziness, + headaches Psychiatric: no depression/+ anxiety  Past Medical History:  Diagnosis Date  . 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 Menorrhagia   . Uterine fibroid    Past Surgical History:  Procedure Laterality Date  . HYSTEROSCOPY WITH D & C  2007  . LAPAROSCOPIC CHOLECYSTECTOMY  Feb 1999  . LAPAROSCOPIC VAGINAL HYSTERECTOMY WITH SALPINGECTOMY Bilateral 11/01/2015   Procedure: LAPAROSCOPIC ASSISTED VAGINAL HYSTERECTOMY WITH SALPINGECTOMY;  Surgeon: Dian Queen, MD;  Location: [facility];  Service: Gynecology;  Laterality: Bilateral;  VAGINAL  . TONSILLECTOMY  1991  . TUBAL LIGATION  Mar 1999  . TYMPANOSTOMY TUBE PLACEMENT Bilateral 1980's   Social History   Socioeconomic History  . Marital status: Married    Spouse name: Not on file  . Number of children: 3: 26, 25, 22 in 07/2019  . Years of education: Not on file  . Highest education level: Not on file  Occupational History  . Nurse  Tobacco Use  . Smoking status: Never Smoker  . Smokeless tobacco: Never Used  Substance and Sexual Activity  . Alcohol use:  Socially  . Drug use: No  . Sexual activity: Not on file  Other Topics Concern  . Not on file  Social History Narrative  . Not on file   Social Determinants of  Health   Financial Resource Strain:   . Difficulty of Paying Living Expenses: Not on file  Food Insecurity:   . Worried About Charity fundraiser in the Last Year: Not on file  . Ran Out of Food in the Last Year: Not on file  Transportation Needs:   . Lack of Transportation (Medical): Not on file  . Lack of Transportation (Non-Medical): Not on file  Physical Activity:   . Days of Exercise per Week: Not on file  . Minutes of Exercise per Session: Not on file  Stress:   . Feeling of Stress : Not on file  Social Connections:   . Frequency of Communication with Friends and Family: Not on file  . Frequency of Social Gatherings with Friends and Family: Not on file  . Attends Religious Services: Not on file  . Active Member of Clubs or Organizations: Not on file  . Attends Archivist Meetings: Not on file  . Marital Status: Not on file  Intimate Partner Violence:   . Fear of Current or Ex-Partner: Not on file  . Emotionally Abused: Not on file  . Physically Abused: Not on file  . Sexually Abused: Not on file   Current Outpatient Medications on File Prior to Visit  Medication Sig Dispense Refill  .  ALPRAZolam (XANAX) 0.25 MG tablet Take 1 tablet po BID prn anxiety 45 tablet 1  . amLODipine (NORVASC) 2.5 MG tablet Take 1.5 tablets po QD 45 tablet 2  . hydrochlorothiazide (HYDRODIURIL) 25 MG tablet Take 1 tablet (25 mg total) by mouth daily. 30 tablet 2  . levothyroxine (SYNTHROID) 175 MCG tablet Take 1 tablet (175 mcg total) by mouth daily before breakfast. 30 tablet 2  . oxymetazoline (AFRIN) 0.05 % nasal spray Place 1 spray into both nostrils as needed for congestion.     No current facility-administered medications on file prior to visit.   No Known Allergies Family History  Problem Relation Age of Onset  . Cancer Mother   . Thyroid disease Paternal Grandmother   . Heart disease Paternal Grandmother   . Diabetes Neg Hx     PE: BP 140/70   Pulse 90   Ht 5\' 3"  (1.6  m)   Wt 170 lb (77.1 kg)   LMP 10/08/2015 (Exact Date)   SpO2 99%   BMI 30.11 kg/m  Wt Readings from Last 3 Encounters:  07/19/19 177 lb 6.4 oz (80.5 kg)  12/06/18 185 lb (83.9 kg)  11/18/18 184 lb (83.5 kg)   Constitutional: slightly overweight, in NAD Eyes: PERRLA, EOMI, no exophthalmos ENT: moist mucous membranes, no thyromegaly, no thyroid nodules palpable, no cervical lymphadenopathy Cardiovascular: RRR, No MRG Respiratory: CTA B Gastrointestinal: abdomen soft, NT, ND, BS+ Musculoskeletal: no deformities, strength intact in all 4;  Skin: moist, warm, no rashes Neurological: no tremor with outstretched hands, DTR normal in all 4  ASSESSMENT: 1. Thyroid nodule  2.  Treatment of thyroiditis  PLAN: 1. Thyroid nodule - I reviewed the images of her thyroid ultrasound along with the patient. I pointed out that the dominant nodule is not large, and also, it is: - not hypoechoic, intact it is hyperechoic and similar to other multiple scattered small of thyroid nodules throughout the entire lab - without microcalcifications - without internal blood flow - more wide than tall - The nodule was seen in 2015 and it does appear to have grown, however, this is most likely a consequence of increasing autoimmunity (higher thyroid antibody titer) with subsequent increase inflammation.  We discussed about the concept of pseudonodules, and I explained that this nodule fits the criteria very well.  These nodules are not worrisome and may disappear with time when inflammation decreases. - She does not have a family history of thyroid cancer or personal history of radiation to head or neck to increase her risk for cancer - She does have some neck compression symptoms, but I explained that, with Hashimoto's thyroiditis, transient increase in the size of the thyroid is not unusual and resolved by itself. - We will start selenium to decrease the inflammation in the gland (see below) - We discussed  about the possibility of surgery but I would not recommend this quite now, with mild compression symptoms that may resolve by themselves - I will see her back in 1 year   2.  Hashimoto's hypothyroidism - we had a long discussion about her Hashimoto thyroiditis diagnosis. I explained that this is an autoimmune disorder, in which she develops antibodies against her own thyroid. The antibodies bind to the thyroid tissue and cause inflammation, and, eventually, destruction of the gland and hypothyroidism.  - I also explained that thyroid enlargement especially at the beginning of her Hashimoto thyroiditis course is not uncommon, and it has a waxing and waning character.  - We discussed  about treatment for Hashimoto thyroiditis, which is actually limited to thyroid hormones in case her TFTs are abnormal. Supplements like selenium has been tried with various results, some showing improvement in the TPO antibodies. However, there are no randomized controlled trials of this are consistent results between trials. We also discussed about ways to improve her immune system (relaxation, diet, exercise, sleep) to reduce the Ab titer and, subsequently, the thyroid inflammation. - latest thyroid labs reviewed with pt >> normal: Lab Results  Component Value Date   TSH 1.500 07/20/2019   - she continues on LT4 175 mcg daily - pt feels good on this dose. - we discussed about taking the thyroid hormone every day, with water, >30 minutes before breakfast, separated by >4 hours from acid reflux medications, calcium, iron, multivitamins. Pt. is taking it correctly. - For now, to help with decreasing her thyroid antibodies, I suggested selenium 200 mcg daily - We will repeat her thyroid labs and her antibodies in 3 to 4 months  Orders Placed This Encounter  Procedures  . xtpit - TSH  . xtpit - free T4  . ATA - ThyCa  . TPO Ab - thyroid   Philemon Kingdom, MD PhD [facility] Endocrinology

## 2019-07-27 DIAGNOSIS — F411 Generalized anxiety disorder: Secondary | ICD-10-CM | POA: Insufficient documentation

## 2019-07-27 DIAGNOSIS — N959 Unspecified menopausal and perimenopausal disorder: Secondary | ICD-10-CM | POA: Insufficient documentation

## 2019-07-27 DIAGNOSIS — E042 Nontoxic multinodular goiter: Secondary | ICD-10-CM | POA: Insufficient documentation

## 2019-07-27 NOTE — Progress Notes (Signed)
Patient saw endocrinology 07/26/2019

## 2019-07-28 ENCOUNTER — Encounter: Payer: Self-pay | Admitting: Adult Health

## 2019-07-28 ENCOUNTER — Ambulatory Visit: Payer: 59 | Admitting: Adult Health

## 2019-07-28 ENCOUNTER — Other Ambulatory Visit: Payer: Self-pay

## 2019-07-28 VITALS — BP 156/76 | HR 80 | Temp 97.8°F | Resp 16 | Ht 63.0 in | Wt 180.8 lb

## 2019-07-28 DIAGNOSIS — E042 Nontoxic multinodular goiter: Secondary | ICD-10-CM

## 2019-07-28 DIAGNOSIS — I10 Essential (primary) hypertension: Secondary | ICD-10-CM | POA: Diagnosis not present

## 2019-07-28 DIAGNOSIS — F411 Generalized anxiety disorder: Secondary | ICD-10-CM

## 2019-07-28 DIAGNOSIS — G4700 Insomnia, unspecified: Secondary | ICD-10-CM

## 2019-07-28 MED ORDER — TRAZODONE HCL 50 MG PO TABS: 50.0000 mg | ORAL_TABLET | Freq: Every day | ORAL | 0 refills | Status: DC

## 2019-07-28 MED ORDER — AMLODIPINE BESYLATE 5 MG PO TABS: 5.0000 mg | ORAL_TABLET | Freq: Every day | ORAL | 0 refills | Status: DC

## 2019-07-28 NOTE — Progress Notes (Signed)
[facility] [place], [place] [address]  Internal MEDICINE  Office Visit Note  Patient Name: Brenda Schneider  [id number]  [id number]  Date of Service: 07/31/2019  Chief Complaint  Patient presents with  . Follow-up    U/s  . Hypothyroidism    HPI  Patient is here today for routine follow-up on her thyroid US. She was experiencing abnormal thyroid symptoms such as hot and cold intolerance, weight gain, stress and anxiety as well as insomnia. She also was able to palpate thyroid nodules or an enlarged thyroid. 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 These symptoms prompted a thyroid US as well as thyroid antibody levels. Thyroid US revealed multiple thyroid nodules as well as inflammation. Thyroid antibodies were also quite elevated. Patient has been seen by endocrinology this week, on Tuesday, they have recommended her starting Selenium to help improve her thyroid inflammation as well as an anti-inflammatory diet. Follow up thyroid US will be managed by endocrinology and plan to monitor annually at this point. Have plans to repeat antibody testing in April with hopes they have improved as well as improvement in her symptoms. Patient most concerned about her blood pressure, insomnia and weight gain. Blood pressure today is still elevated despite an increase in her amlodipine at last visit. Patient denies chest pain, palpitations or headaches. Alprazolam was given at last visit to help with sleeping, she states she has been very hesitant to take this medication as she is aware of the negative side effects, she has tried taking a few doses and has not noticed improvement in her sleeping. An average of 15-20 pound weight gain since November, endocrinologist strongly feels as though this is from her thyroid, denies any edema or orthopnea.    Current Medication: Outpatient Encounter Medications as of 07/28/2019  Medication Sig  . ALPRAZolam  (XANAX) 0.25 MG tablet Take 1 tablet po BID prn anxiety  . hydrochlorothiazide (HYDRODIURIL) 25 MG tablet Take 1 tablet (25 mg total) by mouth daily.  Marland Kitchen levothyroxine (SYNTHROID) 175 MCG tablet Take 1 tablet (175 mcg total) by mouth daily before breakfast.  . oxymetazoline (AFRIN) 0.05 % nasal spray Place 1 spray into both nostrils as needed for congestion.  . [DISCONTINUED] amLODipine (NORVASC) 2.5 MG tablet Take 1.5 tablets po QD  . amLODipine (NORVASC) 5 MG tablet Take 1 tablet (5 mg total) by mouth daily.  . traZODone (DESYREL) 50 MG tablet Take 1 tablet (50 mg total) by mouth at bedtime.   No facility-administered encounter medications on file as of 07/28/2019.    Surgical History: Past Surgical History:  Procedure Laterality Date  . HYSTEROSCOPY WITH D & C  2007  . LAPAROSCOPIC CHOLECYSTECTOMY  Feb 1999  . LAPAROSCOPIC VAGINAL HYSTERECTOMY WITH SALPINGECTOMY Bilateral 11/01/2015   Procedure: LAPAROSCOPIC ASSISTED VAGINAL HYSTERECTOMY WITH SALPINGECTOMY;  Surgeon: Dian Queen, MD;  Location: [facility];  Service: Gynecology;  Laterality: Bilateral;  VAGINAL  . TONSILLECTOMY  1991  . TUBAL LIGATION  Mar 1999  . TYMPANOSTOMY TUBE PLACEMENT Bilateral 1980's    Medical History: Past Medical History:  Diagnosis Date  . 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 Menorrhagia   . Uterine fibroid     Family History: Family History  Problem Relation Age of Onset  . Cancer Mother   . Thyroid disease Paternal Grandmother   . Heart disease Paternal Grandmother   .  Diabetes Neg Hx     Social History   Socioeconomic History  . Marital status: Married    Spouse name: Not on file  . Number of children: Not on file  . Years of education: Not on file  . Highest education level: Not on file  Occupational History  . Not on file  Tobacco Use  . Smoking status: Never Smoker  . Smokeless  tobacco: Never Used  Substance and Sexual Activity  . Alcohol use: Never  . Drug use: No  . Sexual activity: Not on file  Other Topics Concern  . Not on file  Social History Narrative  . Not on file   Social Determinants of Health   Financial Resource Strain:   . Difficulty of Paying Living Expenses: Not on file  Food Insecurity:   . Worried About Charity fundraiser in the Last Year: Not on file  . Ran Out of Food in the Last Year: Not on file  Transportation Needs:   . Lack of Transportation (Medical): Not on file  . Lack of Transportation (Non-Medical): Not on file  Physical Activity:   . Days of Exercise per Week: Not on file  . Minutes of Exercise per Session: Not on file  Stress:   . Feeling of Stress : Not on file  Social Connections:   . Frequency of Communication with Friends and Family: Not on file  . Frequency of Social Gatherings with Friends and Family: Not on file  . Attends Religious Services: Not on file  . Active Member of Clubs or Organizations: Not on file  . Attends Archivist Meetings: Not on file  . Marital Status: Not on file  Intimate Partner Violence:   . Fear of Current or Ex-Partner: Not on file  . Emotionally Abused: Not on file  . Physically Abused: Not on file  . Sexually Abused: Not on file      Review of Systems  Constitutional: Positive for fatigue and unexpected weight change. Negative for chills.  HENT: Negative for congestion, rhinorrhea, sneezing and sore throat.   Eyes: Negative for photophobia, pain and redness.  Respiratory: Negative for cough, chest tightness and shortness of breath.   Cardiovascular: Negative for chest pain and palpitations.  Gastrointestinal: Negative for abdominal pain, constipation, diarrhea, nausea and vomiting.  Endocrine: Negative.   Genitourinary: Negative for dysuria and frequency.  Musculoskeletal: Negative for arthralgias, back pain, joint swelling and neck pain.  Skin: Negative for rash.   Allergic/Immunologic: Negative.   Neurological: Negative for tremors and numbness.  Hematological: Negative for adenopathy. Does not bruise/bleed easily.  Psychiatric/Behavioral: Positive for sleep disturbance. Negative for behavioral problems. The patient is nervous/anxious.     Vital Signs: BP (!) 156/76   Pulse 80   Temp 97.8 F (36.6 C)   Resp 16   Ht 5\' 3"  (1.6 m)   Wt 180 lb 12.8 oz (82 kg)   LMP 10/08/2015 (Exact Date)   SpO2 99%   BMI 32.03 kg/m    Physical Exam Vitals and nursing note reviewed.  Constitutional:      General: She is not in acute distress.    Appearance: She is well-developed. She is not diaphoretic.  HENT:     Head: Normocephalic and atraumatic.     Mouth/Throat:     Pharynx: No oropharyngeal exudate.  Eyes:     Pupils: Pupils are equal, round, and reactive to light.  Neck:     Thyroid: Thyromegaly present.  Vascular: No JVD.     Trachea: No tracheal deviation.  Cardiovascular:     Rate and Rhythm: Normal rate and regular rhythm.     Heart sounds: Normal heart sounds. No murmur. No friction rub. No gallop.   Pulmonary:     Effort: Pulmonary effort is normal. No respiratory distress.     Breath sounds: Normal breath sounds. No wheezing or rales.  Chest:     Chest wall: No tenderness.  Abdominal:     Palpations: Abdomen is soft.     Tenderness: There is no abdominal tenderness. There is no guarding.  Musculoskeletal:        General: Normal range of motion.     Cervical back: Normal range of motion and neck supple.  Lymphadenopathy:     Cervical: No cervical adenopathy.  Skin:    General: Skin is warm and dry.  Neurological:     Mental Status: She is alert and oriented to person, place, and time.     Cranial Nerves: No cranial nerve deficit.  Psychiatric:        Behavior: Behavior normal.        Thought Content: Thought content normal.        Judgment: Judgment normal.    Assessment/Plan: 1. Essential hypertension Increase  amlodipine to 5 mg daily. Continue to monitor blood pressures at home. Continue with 25 mg HCTZ. - amLODipine (NORVASC) 5 MG tablet; Take 1 tablet (5 mg total) by mouth daily.  Dispense: 30 tablet; Refill: 0  2. Multinodular thyroid Followed by endocrinology, per their report routine follow up needed for thyroid antibodies and thyroid US. They started patient on Selenium in hopes to help reduce inflammation. Endocrinology feels as though the many symptoms patient has been experiencing are related to her thyroid abnormalities and will improve as thyroid inflammation goes down.  3. Insomnia, unspecified type Patient did not notice any improvements with Alprazolam and is very hesitant to continue with this medication. Will start with Trazodone and encouraged her to start with 1/2 tablet and titrate up to 1 tablet as she feels she needs to help with sleep. - traZODone (DESYREL) 50 MG tablet; Take 1 tablet (50 mg total) by mouth at bedtime.  Dispense: 30 tablet; Refill: 0  4. Generalized anxiety disorder Feels as though her anxiety is from lack of sleep. Has non-pharmacological ways of managing her stress and feels it will improve once she is able to get adequate rest.  General Counseling: Tenesia verbalizes understanding of the findings of todays visit and agrees with plan of treatment. I have discussed any further diagnostic evaluation that may be needed or ordered today. We also reviewed her medications today. she has been encouraged to call the office with any questions or concerns that should arise related to todays visit.    No orders of the defined types were placed in this encounter.   Meds ordered this encounter  Medications  . amLODipine (NORVASC) 5 MG tablet    Sig: Take 1 tablet (5 mg total) by mouth daily.    Dispense:  30 tablet    Refill:  0  . traZODone (DESYREL) 50 MG tablet    Sig: Take 1 tablet (50 mg total) by mouth at bedtime.    Dispense:  30 tablet    Refill:  0     Time spent: 25 Minutes   This patient was seen by Orson Gear AGNP-C in Collaboration with Dr Lavera Guise as a part of collaborative care  agreement     Kendell Bane AGNP-C Internal medicine

## 2019-08-10 ENCOUNTER — Other Ambulatory Visit: Payer: Self-pay | Admitting: Nurse Practitioner

## 2019-08-10 ENCOUNTER — Encounter: Payer: Self-pay | Admitting: Nurse Practitioner

## 2019-08-10 ENCOUNTER — Other Ambulatory Visit: Payer: Self-pay

## 2019-08-10 DIAGNOSIS — B001 Herpesviral vesicular dermatitis: Secondary | ICD-10-CM

## 2019-08-10 MED ORDER — ACYCLOVIR 400 MG PO TABS: 400.0000 mg | ORAL_TABLET | Freq: Three times a day (TID) | ORAL | 2 refills | Status: DC

## 2019-08-10 NOTE — Progress Notes (Signed)
Sent prescription for acyclovir 400mg  to [facility]. Take three times daily for next 10 days.

## 2019-08-23 ENCOUNTER — Telehealth: Payer: Self-pay

## 2019-08-23 NOTE — Telephone Encounter (Signed)
Confirmed virtual visit with patient. klh 

## 2019-08-25 ENCOUNTER — Encounter: Payer: Self-pay | Admitting: Adult Health

## 2019-08-25 ENCOUNTER — Ambulatory Visit: Payer: 59 | Admitting: Adult Health

## 2019-08-25 VITALS — BP 138/84 | HR 76 | Ht 63.0 in | Wt 167.0 lb

## 2019-08-25 DIAGNOSIS — E063 Autoimmune thyroiditis: Secondary | ICD-10-CM

## 2019-08-25 DIAGNOSIS — I10 Essential (primary) hypertension: Secondary | ICD-10-CM | POA: Diagnosis not present

## 2019-08-25 DIAGNOSIS — G4700 Insomnia, unspecified: Secondary | ICD-10-CM | POA: Diagnosis not present

## 2019-08-25 DIAGNOSIS — F411 Generalized anxiety disorder: Secondary | ICD-10-CM

## 2019-08-25 MED ORDER — AMLODIPINE BESYLATE 5 MG PO TABS: 5.0000 mg | ORAL_TABLET | Freq: Every day | ORAL | 1 refills | Status: DC

## 2019-08-25 MED ORDER — LEVOTHYROXINE SODIUM 175 MCG PO TABS: 175.0000 ug | ORAL_TABLET | Freq: Every day | ORAL | 1 refills | Status: DC

## 2019-08-25 MED ORDER — HYDROCHLOROTHIAZIDE 25 MG PO TABS: 25.0000 mg | ORAL_TABLET | Freq: Every day | ORAL | 1 refills | Status: DC

## 2019-08-25 NOTE — Progress Notes (Signed)
[facility] [place], [place] [address]  Internal MEDICINE  Telephone Visit  Patient Name: Brenda Schneider  [id number]  [id number]  Date of Service: 08/25/2019  I connected with the patient at 402 by telephone and verified the patients identity using two identifiers.   I discussed the limitations, risks, security and privacy concerns of performing an evaluation and management service by telephone and the availability of in person appointments. I also discussed with the patient that there may be a patient responsible charge related to the service.  The patient expressed understanding and agrees to proceed.    Chief Complaint  Patient presents with  . Telephone Assessment  . Telephone Screen  . Hypertension    HPI  Pt is seen today via video.  She is following up on HTN.  At last visit we increased her amlodipine to 5mg  daily.  This has lowered her bp.  Today is is 138/84.  She Denies Chest pain, Shortness of breath, palpitations, headache, or blurred vision.  Her anxiety remains under control.  She has stopped the ativan completely. She reports her insomnia has improved greatly with trazodone.  She has not even needed to take it after the initial few days.  She is sleeping 8-9 hours a night without medication.    Current Medication: Outpatient Encounter Medications as of 08/25/2019  Medication Sig  . acyclovir (ZOVIRAX) 400 MG tablet Take 1 tablet (400 mg total) by mouth 3 (three) times daily.  Marland Kitchen ALPRAZolam (XANAX) 0.25 MG tablet Take 1 tablet po BID prn anxiety  . amLODipine (NORVASC) 5 MG tablet Take 1 tablet (5 mg total) by mouth daily.  . hydrochlorothiazide (HYDRODIURIL) 25 MG tablet Take 1 tablet (25 mg total) by mouth daily.  Marland Kitchen levothyroxine (SYNTHROID) 175 MCG tablet Take 1 tablet (175 mcg total) by mouth daily before breakfast.  . oxymetazoline (AFRIN) 0.05 % nasal spray Place 1 spray into both nostrils as needed for congestion.  . traZODone (DESYREL) 50  MG tablet Take 1 tablet (50 mg total) by mouth at bedtime.  . [DISCONTINUED] amLODipine (NORVASC) 5 MG tablet Take 1 tablet (5 mg total) by mouth daily.  . [DISCONTINUED] hydrochlorothiazide (HYDRODIURIL) 25 MG tablet Take 1 tablet (25 mg total) by mouth daily.  . [DISCONTINUED] levothyroxine (SYNTHROID) 175 MCG tablet Take 1 tablet (175 mcg total) by mouth daily before breakfast.   No facility-administered encounter medications on file as of 08/25/2019.    Surgical History: Past Surgical History:  Procedure Laterality Date  . HYSTEROSCOPY WITH D & C  2007  . LAPAROSCOPIC CHOLECYSTECTOMY  Feb 1999  . LAPAROSCOPIC VAGINAL HYSTERECTOMY WITH SALPINGECTOMY Bilateral 11/01/2015   Procedure: LAPAROSCOPIC ASSISTED VAGINAL HYSTERECTOMY WITH SALPINGECTOMY;  Surgeon: Dian Queen, MD;  Location: [place];  Service: Gynecology;  Laterality: Bilateral;  VAGINAL  . TONSILLECTOMY  1991  . TUBAL LIGATION  Mar 1999  . TYMPANOSTOMY TUBE PLACEMENT Bilateral 1980's    Medical History: Past Medical History:  Diagnosis Date  . 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 Menorrhagia   . Uterine fibroid     Family History: Family History  Problem Relation Age of Onset  . Cancer Mother   . Thyroid disease Paternal Grandmother   . Heart disease Paternal Grandmother   . Diabetes Neg Hx     Social History   Socioeconomic History  . Marital status: Married    Spouse name: Not on  file  . Number of children: Not on file  . Years of education: Not on file  . Highest education level: Not on file  Occupational History  . Not on file  Tobacco Use  . Smoking status: Never Smoker  . Smokeless tobacco: Never Used  Substance and Sexual Activity  . Alcohol use: Never  . Drug use: No  . Sexual activity: Not on file  Other Topics Concern  . Not on file  Social History Narrative  . Not on file   Social  Determinants of Health   Financial Resource Strain:   . Difficulty of Paying Living Expenses: Not on file  Food Insecurity:   . Worried About Charity fundraiser in the Last Year: Not on file  . Ran Out of Food in the Last Year: Not on file  Transportation Needs:   . Lack of Transportation (Medical): Not on file  . Lack of Transportation (Non-Medical): Not on file  Physical Activity:   . Days of Exercise per Week: Not on file  . Minutes of Exercise per Session: Not on file  Stress:   . Feeling of Stress : Not on file  Social Connections:   . Frequency of Communication with Friends and Family: Not on file  . Frequency of Social Gatherings with Friends and Family: Not on file  . Attends Religious Services: Not on file  . Active Member of Clubs or Organizations: Not on file  . Attends Archivist Meetings: Not on file  . Marital Status: Not on file  Intimate Partner Violence:   . Fear of Current or Ex-Partner: Not on file  . Emotionally Abused: Not on file  . Physically Abused: Not on file  . Sexually Abused: Not on file      Review of Systems  Constitutional: Negative for chills, fatigue and unexpected weight change.  HENT: Negative for congestion, rhinorrhea, sneezing and sore throat.   Eyes: Negative for photophobia, pain and redness.  Respiratory: Negative for cough, chest tightness and shortness of breath.   Cardiovascular: Negative for chest pain and palpitations.  Gastrointestinal: Negative for abdominal pain, constipation, diarrhea, nausea and vomiting.  Endocrine: Negative.   Genitourinary: Negative for dysuria and frequency.  Musculoskeletal: Negative for arthralgias, back pain, joint swelling and neck pain.  Skin: Negative for rash.  Allergic/Immunologic: Negative.   Neurological: Negative for tremors and numbness.  Hematological: Negative for adenopathy. Does not bruise/bleed easily.  Psychiatric/Behavioral: Negative for behavioral problems and sleep  disturbance. The patient is not nervous/anxious.     Vital Signs: BP 138/84   Pulse 76   Ht 5\' 3"  (1.6 m)   Wt 167 lb (75.8 kg)   LMP 10/08/2015 (Exact Date)   BMI 29.58 kg/m    Observation/Objective:  Well appearing, NAD noted.    Assessment/Plan: 1. Essential hypertension Continue Norvasc and HCTZ as directed. Good bp control at this time.  Continue to log bps.  - amLODipine (NORVASC) 5 MG tablet; Take 1 tablet (5 mg total) by mouth daily.  Dispense: 90 tablet; Refill: 1 - hydrochlorothiazide (HYDRODIURIL) 25 MG tablet; Take 1 tablet (25 mg total) by mouth daily.  Dispense: 90 tablet; Refill: 1  2. Insomnia, unspecified type Improved, not taking nightly trazadone.  Continue to follow.  3. Generalized anxiety disorder Stable, not currently needing xanax, she feels much better now that she is sleeping.  Her anxiety is easily managed.   4. Acquired autoimmune hypothyroidism Refilled synthroid.  - levothyroxine (SYNTHROID) 175 MCG tablet;  Take 1 tablet (175 mcg total) by mouth daily before breakfast.  Dispense: 90 tablet; Refill: 1  General Counseling: Phil verbalizes understanding of the findings of today's phone visit and agrees with plan of treatment. I have discussed any further diagnostic evaluation that may be needed or ordered today. We also reviewed her medications today. she has been encouraged to call the office with any questions or concerns that should arise related to todays visit.    No orders of the defined types were placed in this encounter.   Meds ordered this encounter  Medications  . amLODipine (NORVASC) 5 MG tablet    Sig: Take 1 tablet (5 mg total) by mouth daily.    Dispense:  90 tablet    Refill:  1  . hydrochlorothiazide (HYDRODIURIL) 25 MG tablet    Sig: Take 1 tablet (25 mg total) by mouth daily.    Dispense:  90 tablet    Refill:  1  . levothyroxine (SYNTHROID) 175 MCG tablet    Sig: Take 1 tablet (175 mcg total) by mouth daily before  breakfast.    Dispense:  90 tablet    Refill:  1    Time spent: 20 Minutes    Orson Gear AGNP-C Internal medicine

## 2019-09-29 ENCOUNTER — Other Ambulatory Visit (INDEPENDENT_AMBULATORY_CARE_PROVIDER_SITE_OTHER): Payer: 59

## 2019-09-29 ENCOUNTER — Other Ambulatory Visit: Payer: Self-pay | Admitting: Internal Medicine

## 2019-09-29 DIAGNOSIS — E063 Autoimmune thyroiditis: Secondary | ICD-10-CM

## 2019-09-29 DIAGNOSIS — E038 Other specified hypothyroidism: Secondary | ICD-10-CM | POA: Diagnosis not present

## 2019-09-29 LAB — TSH: TSH: 0.13 u[IU]/mL — ABNORMAL LOW (ref 0.35–4.50)

## 2019-09-29 LAB — T4, FREE: Free T4: 1.36 ng/dL (ref 0.60–1.60)

## 2019-09-29 MED ORDER — LEVOTHYROXINE SODIUM 150 MCG PO TABS: 150.0000 ug | ORAL_TABLET | Freq: Every day | ORAL | 3 refills | Status: DC

## 2019-09-30 LAB — THYROID PEROXIDASE ANTIBODY: Thyroperoxidase Ab SerPl-aCnc: 159 IU/mL — ABNORMAL HIGH (ref <9)

## 2019-09-30 LAB — THYROGLOBULIN ANTIBODY: Thyroglobulin Ab: 3 IU/mL — ABNORMAL HIGH (ref <=1)

## 2019-11-08 ENCOUNTER — Other Ambulatory Visit: Payer: Self-pay

## 2019-11-08 ENCOUNTER — Other Ambulatory Visit (INDEPENDENT_AMBULATORY_CARE_PROVIDER_SITE_OTHER): Payer: 59

## 2019-11-08 DIAGNOSIS — E063 Autoimmune thyroiditis: Secondary | ICD-10-CM | POA: Diagnosis not present

## 2019-11-08 DIAGNOSIS — E038 Other specified hypothyroidism: Secondary | ICD-10-CM | POA: Diagnosis not present

## 2019-11-09 ENCOUNTER — Other Ambulatory Visit: Payer: Self-pay | Admitting: Internal Medicine

## 2019-11-09 DIAGNOSIS — E038 Other specified hypothyroidism: Secondary | ICD-10-CM

## 2019-11-09 DIAGNOSIS — E063 Autoimmune thyroiditis: Secondary | ICD-10-CM

## 2019-11-09 LAB — T4, FREE: Free T4: 0.87 ng/dL (ref 0.60–1.60)

## 2019-11-09 LAB — TSH: TSH: 4.49 u[IU]/mL (ref 0.35–4.50)

## 2019-11-22 ENCOUNTER — Telehealth: Payer: Self-pay

## 2019-11-22 NOTE — Telephone Encounter (Signed)
Called lmom informing patient of appointment on 11/24/2019. klh 

## 2019-11-24 ENCOUNTER — Encounter: Payer: Self-pay | Admitting: Adult Health

## 2019-11-24 ENCOUNTER — Other Ambulatory Visit: Payer: Self-pay

## 2019-11-24 ENCOUNTER — Ambulatory Visit: Payer: 59 | Admitting: Adult Health

## 2019-11-24 VITALS — BP 140/88 | HR 81 | Temp 98.0°F | Resp 16 | Ht 63.0 in | Wt 180.4 lb

## 2019-11-24 DIAGNOSIS — I10 Essential (primary) hypertension: Secondary | ICD-10-CM | POA: Diagnosis not present

## 2019-11-24 DIAGNOSIS — G4700 Insomnia, unspecified: Secondary | ICD-10-CM

## 2019-11-24 DIAGNOSIS — F411 Generalized anxiety disorder: Secondary | ICD-10-CM | POA: Diagnosis not present

## 2019-11-24 DIAGNOSIS — E039 Hypothyroidism, unspecified: Secondary | ICD-10-CM

## 2019-11-24 MED ORDER — HYDROCHLOROTHIAZIDE 25 MG PO TABS: 25.0000 mg | ORAL_TABLET | Freq: Every day | ORAL | 1 refills | Status: DC

## 2019-11-24 MED ORDER — AMLODIPINE BESYLATE 10 MG PO TABS: 10.0000 mg | ORAL_TABLET | Freq: Every day | ORAL | 1 refills | Status: DC

## 2019-11-24 NOTE — Progress Notes (Signed)
[facility] [place], [place] [address]  Internal MEDICINE  Office Visit Note  Patient Name: Brenda Schneider  [id number]  [id number]  Date of Service: 12/18/2019  Chief Complaint  Patient presents with  . Hypertension  . Hypothyroidism    HPI  Pt is here for follow up on HTN, and Hypothyroid. She sees endocrinology and has her meds monitor and adjusted there.  She reports her blood pressure has been doing well at home. She denies any recent issues with Insomnia.        Current Medication: Outpatient Encounter Medications as of 11/24/2019  Medication Sig  . acyclovir (ZOVIRAX) 400 MG tablet Take 1 tablet (400 mg total) by mouth 3 (three) times daily.  Marland Kitchen amLODipine (NORVASC) 10 MG tablet Take 1 tablet (10 mg total) by mouth daily.  . hydrochlorothiazide (HYDRODIURIL) 25 MG tablet Take 1 tablet (25 mg total) by mouth daily.  Marland Kitchen levothyroxine (SYNTHROID) 150 MCG tablet Take 1 tablet (150 mcg total) by mouth daily before breakfast.  . oxymetazoline (AFRIN) 0.05 % nasal spray Place 1 spray into both nostrils as needed for congestion.  . [DISCONTINUED] ALPRAZolam (XANAX) 0.25 MG tablet Take 1 tablet po BID prn anxiety  . [DISCONTINUED] amLODipine (NORVASC) 5 MG tablet Take 1 tablet (5 mg total) by mouth daily.  . [DISCONTINUED] hydrochlorothiazide (HYDRODIURIL) 25 MG tablet Take 1 tablet (25 mg total) by mouth daily.  . [DISCONTINUED] traZODone (DESYREL) 50 MG tablet Take 1 tablet (50 mg total) by mouth at bedtime.   No facility-administered encounter medications on file as of 11/24/2019.    Surgical History: Past Surgical History:  Procedure Laterality Date  . HYSTEROSCOPY WITH D & C  2007  . LAPAROSCOPIC CHOLECYSTECTOMY  Feb 1999  . LAPAROSCOPIC VAGINAL HYSTERECTOMY WITH SALPINGECTOMY Bilateral 11/01/2015   Procedure: LAPAROSCOPIC ASSISTED VAGINAL HYSTERECTOMY WITH SALPINGECTOMY;  Surgeon: Dian Queen, MD;  Location: [place];  Service:  Gynecology;  Laterality: Bilateral;  VAGINAL  . TONSILLECTOMY  1991  . TUBAL LIGATION  Mar 1999  . TYMPANOSTOMY TUBE PLACEMENT Bilateral 1980's    Medical History: Past Medical History:  Diagnosis Date  . 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 Menorrhagia   . Uterine fibroid     Family History: Family History  Problem Relation Age of Onset  . Cancer Mother   . Thyroid disease Paternal Grandmother   . Heart disease Paternal Grandmother   . Diabetes Neg Hx     Social History   Socioeconomic History  . Marital status: Married    Spouse name: Not on file  . Number of children: Not on file  . Years of education: Not on file  . Highest education level: Not on file  Occupational History  . Not on file  Tobacco Use  . Smoking status: Never Smoker  . Smokeless tobacco: Never Used  Substance and Sexual Activity  . Alcohol use: Never  . Drug use: No  . Sexual activity: Not on file  Other Topics Concern  . Not on file  Social History Narrative  . Not on file   Social Determinants of Health   Financial Resource Strain:   . Difficulty of Paying Living Expenses:   Food Insecurity:   . Worried About Charity fundraiser in the Last Year:   . Arboriculturist in the Last Year:   Transportation Needs:   . Lack of Transportation (  Medical):   Marland Kitchen Lack of Transportation (Non-Medical):   Physical Activity:   . Days of Exercise per Week:   . Minutes of Exercise per Session:   Stress:   . Feeling of Stress :   Social Connections:   . Frequency of Communication with Friends and Family:   . Frequency of Social Gatherings with Friends and Family:   . Attends Religious Services:   . Active Member of Clubs or Organizations:   . Attends Archivist Meetings:   Marland Kitchen Marital Status:   Intimate Partner Violence:   . Fear of Current or Ex-Partner:   . Emotionally Abused:   Marland Kitchen Physically Abused:   .  Sexually Abused:       Review of Systems  Constitutional: Negative for chills, fatigue and unexpected weight change.  HENT: Negative for congestion, rhinorrhea, sneezing and sore throat.   Eyes: Negative for photophobia, pain and redness.  Respiratory: Negative for cough, chest tightness and shortness of breath.   Cardiovascular: Negative for chest pain and palpitations.  Gastrointestinal: Negative for abdominal pain, constipation, diarrhea, nausea and vomiting.  Endocrine: Negative.   Genitourinary: Negative for dysuria and frequency.  Musculoskeletal: Negative for arthralgias, back pain, joint swelling and neck pain.  Skin: Negative for rash.  Allergic/Immunologic: Negative.   Neurological: Negative for tremors and numbness.  Hematological: Negative for adenopathy. Does not bruise/bleed easily.  Psychiatric/Behavioral: Negative for behavioral problems and sleep disturbance. The patient is not nervous/anxious.     Vital Signs: BP 140/88   Pulse 81   Temp 98 F (36.7 C)   Resp 16   Ht 5\' 3"  (1.6 m)   Wt 180 lb 6.4 oz (81.8 kg)   LMP 10/08/2015 (Exact Date)   SpO2 99%   BMI 31.96 kg/m    Physical Exam Vitals and nursing note reviewed.  Constitutional:      General: She is not in acute distress.    Appearance: She is well-developed. She is not diaphoretic.  HENT:     Head: Normocephalic and atraumatic.     Mouth/Throat:     Pharynx: No oropharyngeal exudate.  Eyes:     Pupils: Pupils are equal, round, and reactive to light.  Neck:     Thyroid: No thyromegaly.     Vascular: No JVD.     Trachea: No tracheal deviation.  Cardiovascular:     Rate and Rhythm: Normal rate and regular rhythm.     Heart sounds: Normal heart sounds. No murmur. No friction rub. No gallop.   Pulmonary:     Effort: Pulmonary effort is normal. No respiratory distress.     Breath sounds: Normal breath sounds. No wheezing or rales.  Chest:     Chest wall: No tenderness.  Abdominal:      Palpations: Abdomen is soft.     Tenderness: There is no abdominal tenderness. There is no guarding.  Musculoskeletal:        General: Normal range of motion.     Cervical back: Normal range of motion and neck supple.  Lymphadenopathy:     Cervical: No cervical adenopathy.  Skin:    General: Skin is warm and dry.  Neurological:     Mental Status: She is alert and oriented to person, place, and time.     Cranial Nerves: No cranial nerve deficit.  Psychiatric:        Behavior: Behavior normal.        Thought Content: Thought content normal.  Judgment: Judgment normal.    Assessment/Plan: 1. Essential hypertension Continue to take Norvasc and HCTZ as discussed.  - amLODipine (NORVASC) 10 MG tablet; Take 1 tablet (10 mg total) by mouth daily.  Dispense: 90 tablet; Refill: 1 - hydrochlorothiazide (HYDRODIURIL) 25 MG tablet; Take 1 tablet (25 mg total) by mouth daily.  Dispense: 90 tablet; Refill: 1  2. Insomnia, unspecified type Continue to practice good sleep hygeine  3. Generalized anxiety disorder Stable, currently not on any medications.   4. Hypothyroidism, unspecified type Continue Synthroid as prescribed.   General Counseling: Lesslie verbalizes understanding of the findings of todays visit and agrees with plan of treatment. I have discussed any further diagnostic evaluation that may be needed or ordered today. We also reviewed her medications today. she has been encouraged to call the office with any questions or concerns that should arise related to todays visit.    No orders of the defined types were placed in this encounter.   Meds ordered this encounter  Medications  . amLODipine (NORVASC) 10 MG tablet    Sig: Take 1 tablet (10 mg total) by mouth daily.    Dispense:  90 tablet    Refill:  1  . hydrochlorothiazide (HYDRODIURIL) 25 MG tablet    Sig: Take 1 tablet (25 mg total) by mouth daily.    Dispense:  90 tablet    Refill:  1    Time spent: 30  Minutes   This patient was seen by Orson Gear AGNP-C in Collaboration with Dr Lavera Guise as a part of collaborative care agreement     Kendell Bane AGNP-C Internal medicine

## 2019-11-29 ENCOUNTER — Telehealth: Payer: Self-pay

## 2019-11-29 NOTE — Telephone Encounter (Signed)
Called lmom informing patient of appointment on 12/01/2019. klh 

## 2019-12-01 ENCOUNTER — Ambulatory Visit: Payer: 59 | Admitting: Adult Health

## 2019-12-01 ENCOUNTER — Other Ambulatory Visit: Payer: Self-pay

## 2019-12-01 ENCOUNTER — Encounter: Payer: Self-pay | Admitting: Adult Health

## 2019-12-01 VITALS — BP 138/86 | HR 77 | Temp 98.0°F | Resp 16 | Ht 63.0 in | Wt 182.8 lb

## 2019-12-01 DIAGNOSIS — Z6832 Body mass index (BMI) 32.0-32.9, adult: Secondary | ICD-10-CM | POA: Diagnosis not present

## 2019-12-01 DIAGNOSIS — I10 Essential (primary) hypertension: Secondary | ICD-10-CM

## 2019-12-01 DIAGNOSIS — F411 Generalized anxiety disorder: Secondary | ICD-10-CM

## 2019-12-01 DIAGNOSIS — E039 Hypothyroidism, unspecified: Secondary | ICD-10-CM | POA: Diagnosis not present

## 2019-12-01 MED ORDER — PHENTERMINE HCL 37.5 MG PO CAPS: 37.5000 mg | ORAL_CAPSULE | ORAL | 0 refills | Status: DC

## 2019-12-01 NOTE — Progress Notes (Signed)
[facility] [place], [place] [address]  Internal MEDICINE  Office Visit Note  Patient Name: Brenda Schneider  [id number]  [id number]  Date of Service: 12/01/2019  Chief Complaint  Patient presents with  . Follow-up    HPI  Pt is here for weight loss consultation.  She is currently 182 pounds.      Current Medication: Outpatient Encounter Medications as of 12/01/2019  Medication Sig  . acyclovir (ZOVIRAX) 400 MG tablet Take 1 tablet (400 mg total) by mouth 3 (three) times daily.  Marland Kitchen amLODipine (NORVASC) 10 MG tablet Take 1 tablet (10 mg total) by mouth daily.  . hydrochlorothiazide (HYDRODIURIL) 25 MG tablet Take 1 tablet (25 mg total) by mouth daily.  Marland Kitchen levothyroxine (SYNTHROID) 150 MCG tablet Take 1 tablet (150 mcg total) by mouth daily before breakfast.  . oxymetazoline (AFRIN) 0.05 % nasal spray Place 1 spray into both nostrils as needed for congestion.   No facility-administered encounter medications on file as of 12/01/2019.    Surgical History: Past Surgical History:  Procedure Laterality Date  . HYSTEROSCOPY WITH D & C  2007  . LAPAROSCOPIC CHOLECYSTECTOMY  Feb 1999  . LAPAROSCOPIC VAGINAL HYSTERECTOMY WITH SALPINGECTOMY Bilateral 11/01/2015   Procedure: LAPAROSCOPIC ASSISTED VAGINAL HYSTERECTOMY WITH SALPINGECTOMY;  Surgeon: Dian Queen, MD;  Location: [place];  Service: Gynecology;  Laterality: Bilateral;  VAGINAL  . TONSILLECTOMY  1991  . TUBAL LIGATION  Mar 1999  . TYMPANOSTOMY TUBE PLACEMENT Bilateral 1980's    Medical History: Past Medical History:  Diagnosis Date  . 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 Menorrhagia   . Uterine fibroid     Family History: Family History  Problem Relation Age of Onset  . Cancer Mother   . Thyroid disease Paternal Grandmother   . Heart disease Paternal Grandmother   . Diabetes Neg Hx      Social History   Socioeconomic History  . Marital status: Married    Spouse name: Not on file  . Number of children: Not on file  . Years of education: Not on file  . Highest education level: Not on file  Occupational History  . Not on file  Tobacco Use  . Smoking status: Never Smoker  . Smokeless tobacco: Never Used  Substance and Sexual Activity  . Alcohol use: Never  . Drug use: No  . Sexual activity: Not on file  Other Topics Concern  . Not on file  Social History Narrative  . Not on file   Social Determinants of Health   Financial Resource Strain:   . Difficulty of Paying Living Expenses:   Food Insecurity:   . Worried About Charity fundraiser in the Last Year:   . Arboriculturist in the Last Year:   Transportation Needs:   . Film/video editor (Medical):   Marland Kitchen Lack of Transportation (Non-Medical):   Physical Activity:   . Days of Exercise per Week:   . Minutes of Exercise per Session:   Stress:   . Feeling of Stress :   Social Connections:   . Frequency of Communication with Friends and Family:   . Frequency of Social Gatherings with Friends and Family:   . Attends Religious Services:   . Active Member of Clubs or Organizations:   . Attends Archivist Meetings:   Marland Kitchen Marital Status:   Intimate Partner Violence:   .  Fear of Current or Ex-Partner:   . Emotionally Abused:   Marland Kitchen Physically Abused:   . Sexually Abused:       Review of Systems  Constitutional: Negative for chills, fatigue and unexpected weight change.  HENT: Negative for congestion, rhinorrhea, sneezing and sore throat.   Eyes: Negative for photophobia, pain and redness.  Respiratory: Negative for cough, chest tightness and shortness of breath.   Cardiovascular: Negative for chest pain and palpitations.  Gastrointestinal: Negative for abdominal pain, constipation, diarrhea, nausea and vomiting.  Endocrine: Negative.   Genitourinary: Negative for dysuria and frequency.   Musculoskeletal: Negative for arthralgias, back pain, joint swelling and neck pain.  Skin: Negative for rash.  Allergic/Immunologic: Negative.   Neurological: Negative for tremors and numbness.  Hematological: Negative for adenopathy. Does not bruise/bleed easily.  Psychiatric/Behavioral: Negative for behavioral problems and sleep disturbance. The patient is not nervous/anxious.     Vital Signs: BP (!) 144/74   Pulse 77   Temp 98 F (36.7 C)   Resp 16   Ht 5\' 3"  (1.6 m)   Wt 182 lb 12.8 oz (82.9 kg)   LMP 10/08/2015 (Exact Date)   SpO2 98%   BMI 32.38 kg/m    Physical Exam Vitals and nursing note reviewed.  Constitutional:      General: She is not in acute distress.    Appearance: She is well-developed. She is not diaphoretic.  HENT:     Head: Normocephalic and atraumatic.     Mouth/Throat:     Pharynx: No oropharyngeal exudate.  Eyes:     Pupils: Pupils are equal, round, and reactive to light.  Neck:     Thyroid: No thyromegaly.     Vascular: No JVD.     Trachea: No tracheal deviation.  Cardiovascular:     Rate and Rhythm: Normal rate and regular rhythm.     Heart sounds: Normal heart sounds. No murmur. No friction rub. No gallop.   Pulmonary:     Effort: Pulmonary effort is normal. No respiratory distress.     Breath sounds: Normal breath sounds. No wheezing or rales.  Chest:     Chest wall: No tenderness.  Abdominal:     Palpations: Abdomen is soft.     Tenderness: There is no abdominal tenderness. There is no guarding.  Musculoskeletal:        General: Normal range of motion.     Cervical back: Normal range of motion and neck supple.  Lymphadenopathy:     Cervical: No cervical adenopathy.  Skin:    General: Skin is warm and dry.  Neurological:     Mental Status: She is alert and oriented to person, place, and time.     Cranial Nerves: No cranial nerve deficit.  Psychiatric:        Behavior: Behavior normal.        Thought Content: Thought content  normal.        Judgment: Judgment normal.     Assessment/Plan: 1. Essential hypertension Continue current bp management.  2. BMI 32.0-32.9,adult .Obesity Counseling: Risk Assessment: An assessment of behavioral risk factors was made today and includes lack of exercise sedentary lifestyle, lack of portion control and poor dietary habits.  Risk Modification Advice: She was counseled on portion control guidelines. Restricting daily caloric intake to 1500. The detrimental long term effects of obesity on her health and ongoing poor compliance was also discussed with the patient.  There is a liability release in patients' chart. There has been  a 10 minute discussion about the side effects including but not limited to elevated blood pressure, anxiety, lack of sleep and dry mouth. Pt understands and will like to start/continue on appetite suppressant at this time. There will be one month RX given at the time of visit with proper follow up. Nova diet plan with restricted calories is given to the pt. Pt understands and agrees with  plan of treatment - Metabolic Test - phentermine 37.5 MG capsule; Take 1 capsule (37.5 mg total) by mouth every morning.  Dispense: 30 capsule; Refill: 0  3. Generalized anxiety disorder Stable, continue current management.   4. Hypothyroidism, unspecified type Continue to take synthroid as directed.   General Counseling: Katie verbalizes understanding of the findings of todays visit and agrees with plan of treatment. I have discussed any further diagnostic evaluation that may be needed or ordered today. We also reviewed her medications today. she has been encouraged to call the office with any questions or concerns that should arise related to todays visit.    Orders Placed This Encounter  Procedures  . Metabolic Test    No orders of the defined types were placed in this encounter.   Time spent: 30 Minutes   This patient was seen by Orson Gear AGNP-C in  Collaboration with Dr Lavera Guise as a part of collaborative care agreement     Kendell Bane AGNP-C Internal medicine

## 2019-12-02 ENCOUNTER — Other Ambulatory Visit (INDEPENDENT_AMBULATORY_CARE_PROVIDER_SITE_OTHER): Payer: 59

## 2019-12-02 DIAGNOSIS — E038 Other specified hypothyroidism: Secondary | ICD-10-CM | POA: Diagnosis not present

## 2019-12-02 DIAGNOSIS — E063 Autoimmune thyroiditis: Secondary | ICD-10-CM | POA: Diagnosis not present

## 2019-12-02 NOTE — Addendum Note (Signed)
Addended by: Kaylyn Lim I on: 12/02/2019 04:39 PM   Modules accepted: Orders

## 2019-12-03 LAB — T4, FREE: Free T4: 1.38 ng/dL (ref 0.82–1.77)

## 2019-12-03 LAB — TSH: TSH: 3.17 u[IU]/mL (ref 0.450–4.500)

## 2019-12-27 ENCOUNTER — Telehealth: Payer: Self-pay

## 2019-12-27 NOTE — Telephone Encounter (Signed)
Called lmom informing patient of appointment on 12/29/2019. klh

## 2019-12-29 ENCOUNTER — Other Ambulatory Visit: Payer: Self-pay

## 2019-12-29 ENCOUNTER — Ambulatory Visit: Payer: 59 | Admitting: Adult Health

## 2019-12-29 ENCOUNTER — Encounter: Payer: Self-pay | Admitting: Adult Health

## 2019-12-29 VITALS — BP 140/75 | HR 90 | Temp 98.0°F | Resp 16 | Ht 63.0 in | Wt 175.4 lb

## 2019-12-29 DIAGNOSIS — I10 Essential (primary) hypertension: Secondary | ICD-10-CM | POA: Diagnosis not present

## 2019-12-29 DIAGNOSIS — E039 Hypothyroidism, unspecified: Secondary | ICD-10-CM

## 2019-12-29 DIAGNOSIS — Z6831 Body mass index (BMI) 31.0-31.9, adult: Secondary | ICD-10-CM

## 2019-12-29 DIAGNOSIS — F411 Generalized anxiety disorder: Secondary | ICD-10-CM

## 2019-12-29 MED ORDER — PHENTERMINE HCL 37.5 MG PO CAPS: 37.5000 mg | ORAL_CAPSULE | ORAL | 0 refills | Status: DC

## 2019-12-29 NOTE — Progress Notes (Signed)
[facility] [place], [place] [address]  Internal MEDICINE  Office Visit Note  Patient Name: Brenda Schneider  [id number]  [id number]  Date of Service: 12/29/2019  Chief Complaint  Patient presents with  . Follow-up    wt loss    HPI  Pt is here for follow up on HTN, Weight loss, GAD and Hypothyroid.  She reports she is doing well.  Her blood pressure is well controlled. Denies Chest pain, Shortness of breath, palpitations, headache, or blurred vision.  Her anxiety is current under control, and she denies any compliaints at this time. Patient is using phentermine for weight loss.  Since our last visit they have lost 7 pounds.  The patient denies any chest pain, palpitations, shortness of breath, constipation, headaches or any other side effects of the medication.  Patient wishes to continue to use this medication for weight loss at this time.   Current Medication: Outpatient Encounter Medications as of 12/29/2019  Medication Sig  . acyclovir (ZOVIRAX) 400 MG tablet Take 1 tablet (400 mg total) by mouth 3 (three) times daily.  Marland Kitchen amLODipine (NORVASC) 10 MG tablet Take 1 tablet (10 mg total) by mouth daily.  . hydrochlorothiazide (HYDRODIURIL) 25 MG tablet Take 1 tablet (25 mg total) by mouth daily.  Marland Kitchen levothyroxine (SYNTHROID) 150 MCG tablet Take 1 tablet (150 mcg total) by mouth daily before breakfast.  . oxymetazoline (AFRIN) 0.05 % nasal spray Place 1 spray into both nostrils as needed for congestion.  . phentermine 37.5 MG capsule Take 1 capsule (37.5 mg total) by mouth every morning.  . [DISCONTINUED] phentermine 37.5 MG capsule Take 1 capsule (37.5 mg total) by mouth every morning.   No facility-administered encounter medications on file as of 12/29/2019.    Surgical History: Past Surgical History:  Procedure Laterality Date  . HYSTEROSCOPY WITH D & C  2007  . LAPAROSCOPIC CHOLECYSTECTOMY  Feb 1999  . LAPAROSCOPIC VAGINAL HYSTERECTOMY WITH SALPINGECTOMY  Bilateral 11/01/2015   Procedure: LAPAROSCOPIC ASSISTED VAGINAL HYSTERECTOMY WITH SALPINGECTOMY;  Surgeon: Dian Queen, MD;  Location: [place];  Service: Gynecology;  Laterality: Bilateral;  VAGINAL  . TONSILLECTOMY  1991  . TUBAL LIGATION  Mar 1999  . TYMPANOSTOMY TUBE PLACEMENT Bilateral 1980's    Medical History: Past Medical History:  Diagnosis Date  . 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 Menorrhagia   . Uterine fibroid     Family History: Family History  Problem Relation Age of Onset  . Cancer Mother   . Thyroid disease Paternal Grandmother   . Heart disease Paternal Grandmother   . Diabetes Neg Hx     Social History   Socioeconomic History  . Marital status: Married    Spouse name: Not on file  . Number of children: Not on file  . Years of education: Not on file  . Highest education level: Not on file  Occupational History  . Not on file  Tobacco Use  . Smoking status: Never Smoker  . Smokeless tobacco: Never Used  Substance and Sexual Activity  . Alcohol use: Never  . Drug use: No  . Sexual activity: Not on file  Other Topics Concern  . Not on file  Social History Narrative  . Not on file   Social Determinants of Health   Financial Resource Strain:   . Difficulty of Paying Living Expenses:   Food Insecurity:   . Worried About  Running Out of Food in the Last Year:   . [place] in the Last Year:   Transportation Needs:   . Lack of Transportation (Medical):   Marland Kitchen Lack of Transportation (Non-Medical):   Physical Activity:   . Days of Exercise per Week:   . Minutes of Exercise per Session:   Stress:   . Feeling of Stress :   Social Connections:   . Frequency of Communication with Friends and Family:   . Frequency of Social Gatherings with Friends and Family:   . Attends Religious Services:   . Active Member of Clubs or Organizations:   .  Attends Archivist Meetings:   Marland Kitchen Marital Status:   Intimate Partner Violence:   . Fear of Current or Ex-Partner:   . Emotionally Abused:   Marland Kitchen Physically Abused:   . Sexually Abused:       Review of Systems  Constitutional: Negative for chills, fatigue and unexpected weight change.  HENT: Negative for congestion, rhinorrhea, sneezing and sore throat.   Eyes: Negative for photophobia, pain and redness.  Respiratory: Negative for cough, chest tightness and shortness of breath.   Cardiovascular: Negative for chest pain and palpitations.  Gastrointestinal: Negative for abdominal pain, constipation, diarrhea, nausea and vomiting.  Endocrine: Negative.   Genitourinary: Negative for dysuria and frequency.  Musculoskeletal: Negative for arthralgias, back pain, joint swelling and neck pain.  Skin: Negative for rash.  Allergic/Immunologic: Negative.   Neurological: Negative for tremors and numbness.  Hematological: Negative for adenopathy. Does not bruise/bleed easily.  Psychiatric/Behavioral: Negative for behavioral problems and sleep disturbance. The patient is not nervous/anxious.     Vital Signs: BP 140/75   Pulse 90   Temp 98 F (36.7 C)   Resp 16   Ht 5\' 3"  (1.6 m)   Wt 175 lb 6.4 oz (79.6 kg)   LMP 10/08/2015 (Exact Date)   SpO2 97%   BMI 31.07 kg/m    Physical Exam Vitals and nursing note reviewed.  Constitutional:      General: She is not in acute distress.    Appearance: She is well-developed. She is not diaphoretic.  HENT:     Head: Normocephalic and atraumatic.     Mouth/Throat:     Pharynx: No oropharyngeal exudate.  Eyes:     Pupils: Pupils are equal, round, and reactive to light.  Neck:     Thyroid: No thyromegaly.     Vascular: No JVD.     Trachea: No tracheal deviation.  Cardiovascular:     Rate and Rhythm: Normal rate and regular rhythm.     Heart sounds: Normal heart sounds. No murmur heard.  No friction rub. No gallop.   Pulmonary:      Effort: Pulmonary effort is normal. No respiratory distress.     Breath sounds: Normal breath sounds. No wheezing or rales.  Chest:     Chest wall: No tenderness.  Abdominal:     Palpations: Abdomen is soft.     Tenderness: There is no abdominal tenderness. There is no guarding.  Musculoskeletal:        General: Normal range of motion.     Cervical back: Normal range of motion and neck supple.  Lymphadenopathy:     Cervical: No cervical adenopathy.  Skin:    General: Skin is warm and dry.  Neurological:     Mental Status: She is alert and oriented to person, place, and time.     Cranial Nerves: No cranial  nerve deficit.  Psychiatric:        Behavior: Behavior normal.        Thought Content: Thought content normal.        Judgment: Judgment normal.    Assessment/Plan: 1. Essential hypertension Controlled, continue current management.   2. BMI 31.0-31.9,adult Obesity Counseling: Risk Assessment: An assessment of behavioral risk factors was made today and includes lack of exercise sedentary lifestyle, lack of portion control and poor dietary habits.  Risk Modification Advice: She was counseled on portion control guidelines. Restricting daily caloric intake to 1200. The detrimental long term effects of obesity on her health and ongoing poor compliance was also discussed with the patient.  There is a liability release in patients' chart. There has been a 10 minute discussion about the side effects including but not limited to elevated blood pressure, anxiety, lack of sleep and dry mouth. Pt understands and will like to start/continue on appetite suppressant at this time. There will be one month RX given at the time of visit with proper follow up. Nova diet plan with restricted calories is given to the pt. Pt understands and agrees with  plan of treatment - phentermine 37.5 MG capsule; Take 1 capsule (37.5 mg total) by mouth every morning.  Dispense: 30 capsule; Refill: 0  3.  Generalized anxiety disorder Controlled, continue to monitor.   4. Hypothyroidism, unspecified type Stable, continue present management.   General Counseling: Mazie verbalizes understanding of the findings of todays visit and agrees with plan of treatment. I have discussed any further diagnostic evaluation that may be needed or ordered today. We also reviewed her medications today. she has been encouraged to call the office with any questions or concerns that should arise related to todays visit.    No orders of the defined types were placed in this encounter.   Meds ordered this encounter  Medications  . phentermine 37.5 MG capsule    Sig: Take 1 capsule (37.5 mg total) by mouth every morning.    Dispense:  30 capsule    Refill:  0    Time spent: 30 Minutes   This patient was seen by Orson Gear AGNP-C in Collaboration with Dr Lavera Guise as a part of collaborative care agreement     Kendell Bane AGNP-C Internal medicine

## 2020-01-26 ENCOUNTER — Ambulatory Visit: Payer: 59 | Admitting: Adult Health

## 2020-01-26 ENCOUNTER — Other Ambulatory Visit: Payer: Self-pay

## 2020-01-26 ENCOUNTER — Encounter: Payer: Self-pay | Admitting: Adult Health

## 2020-01-26 VITALS — BP 142/82 | HR 96 | Temp 98.1°F | Resp 16 | Ht 63.0 in | Wt 167.4 lb

## 2020-01-26 DIAGNOSIS — F411 Generalized anxiety disorder: Secondary | ICD-10-CM | POA: Diagnosis not present

## 2020-01-26 DIAGNOSIS — Z6831 Body mass index (BMI) 31.0-31.9, adult: Secondary | ICD-10-CM | POA: Diagnosis not present

## 2020-01-26 DIAGNOSIS — Z6829 Body mass index (BMI) 29.0-29.9, adult: Secondary | ICD-10-CM | POA: Diagnosis not present

## 2020-01-26 DIAGNOSIS — E039 Hypothyroidism, unspecified: Secondary | ICD-10-CM

## 2020-01-26 DIAGNOSIS — I10 Essential (primary) hypertension: Secondary | ICD-10-CM

## 2020-01-26 MED ORDER — PHENTERMINE HCL 37.5 MG PO CAPS: 37.5000 mg | ORAL_CAPSULE | ORAL | 0 refills | Status: DC

## 2020-01-26 NOTE — Progress Notes (Signed)
[facility] [place], [place] [address]  Internal MEDICINE  Office Visit Note  Patient Name: Brenda Schneider  [id number]  [id number]  Date of Service: 01/26/2020  Chief Complaint  Patient presents with  . Follow-up    wt loss    HPI  Pt is here for follow up on weight loss. Patient is using phentermine for weight loss.  Since our last visit they have lost 8 pounds.  The patient denies any chest pain, palpitations, shortness of breath, constipation, headaches or any other side effects of the medication.  Patient wishes to continue to use this medication for weight loss at this time. She denies any issues with HTN, or anxiety.    Current Medication: Outpatient Encounter Medications as of 01/26/2020  Medication Sig  . acyclovir (ZOVIRAX) 400 MG tablet Take 1 tablet (400 mg total) by mouth 3 (three) times daily.  Marland Kitchen amLODipine (NORVASC) 10 MG tablet Take 1 tablet (10 mg total) by mouth daily.  . hydrochlorothiazide (HYDRODIURIL) 25 MG tablet Take 1 tablet (25 mg total) by mouth daily.  Marland Kitchen levothyroxine (SYNTHROID) 150 MCG tablet Take 1 tablet (150 mcg total) by mouth daily before breakfast.  . oxymetazoline (AFRIN) 0.05 % nasal spray Place 1 spray into both nostrils as needed for congestion.  . phentermine 37.5 MG capsule Take 1 capsule (37.5 mg total) by mouth every morning.  . [DISCONTINUED] phentermine 37.5 MG capsule Take 1 capsule (37.5 mg total) by mouth every morning.   No facility-administered encounter medications on file as of 01/26/2020.    Surgical History: Past Surgical History:  Procedure Laterality Date  . HYSTEROSCOPY WITH D & C  2007  . LAPAROSCOPIC CHOLECYSTECTOMY  Feb 1999  . LAPAROSCOPIC VAGINAL HYSTERECTOMY WITH SALPINGECTOMY Bilateral 11/01/2015   Procedure: LAPAROSCOPIC ASSISTED VAGINAL HYSTERECTOMY WITH SALPINGECTOMY;  Surgeon: Dian Queen, MD;  Location: [place];  Service: Gynecology;  Laterality: Bilateral;  VAGINAL   . TONSILLECTOMY  1991  . TUBAL LIGATION  Mar 1999  . TYMPANOSTOMY TUBE PLACEMENT Bilateral 1980's    Medical History: Past Medical History:  Diagnosis Date  . 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 Menorrhagia   . Uterine fibroid     Family History: Family History  Problem Relation Age of Onset  . Cancer Mother   . Thyroid disease Paternal Grandmother   . Heart disease Paternal Grandmother   . Diabetes Neg Hx     Social History   Socioeconomic History  . Marital status: Married    Spouse name: Not on file  . Number of children: Not on file  . Years of education: Not on file  . Highest education level: Not on file  Occupational History  . Not on file  Tobacco Use  . Smoking status: Never Smoker  . Smokeless tobacco: Never Used  Substance and Sexual Activity  . Alcohol use: Never  . Drug use: No  . Sexual activity: Not on file  Other Topics Concern  . Not on file  Social History Narrative  . Not on file   Social Determinants of Health   Financial Resource Strain:   . Difficulty of Paying Living Expenses:   Food Insecurity:   . Worried About Charity fundraiser in the Last Year:   . Arboriculturist in the Last Year:   Transportation Needs:   . Film/video editor (Medical):   Marland Kitchen Lack of  Transportation (Non-Medical):   Physical Activity:   . Days of Exercise per Week:   . Minutes of Exercise per Session:   Stress:   . Feeling of Stress :   Social Connections:   . Frequency of Communication with Friends and Family:   . Frequency of Social Gatherings with Friends and Family:   . Attends Religious Services:   . Active Member of Clubs or Organizations:   . Attends Archivist Meetings:   Marland Kitchen Marital Status:   Intimate Partner Violence:   . Fear of Current or Ex-Partner:   . Emotionally Abused:   Marland Kitchen Physically Abused:   . Sexually Abused:       Review of Systems   Constitutional: Negative for chills, fatigue and unexpected weight change.  HENT: Negative for congestion, rhinorrhea, sneezing and sore throat.   Eyes: Negative for photophobia, pain and redness.  Respiratory: Negative for cough, chest tightness and shortness of breath.   Cardiovascular: Negative for chest pain and palpitations.  Gastrointestinal: Negative for abdominal pain, constipation, diarrhea, nausea and vomiting.  Endocrine: Negative.   Genitourinary: Negative for dysuria and frequency.  Musculoskeletal: Negative for arthralgias, back pain, joint swelling and neck pain.  Skin: Negative for rash.  Allergic/Immunologic: Negative.   Neurological: Negative for tremors and numbness.  Hematological: Negative for adenopathy. Does not bruise/bleed easily.  Psychiatric/Behavioral: Negative for behavioral problems and sleep disturbance. The patient is not nervous/anxious.     Vital Signs: BP (!) 156/85   Pulse 96   Temp 98.1 F (36.7 C)   Resp 16   Ht 5\' 3"  (1.6 m)   Wt 167 lb 6.4 oz (75.9 kg)   LMP 10/08/2015 (Exact Date)   SpO2 100%   BMI 29.65 kg/m    Physical Exam Vitals and nursing note reviewed.  Constitutional:      General: She is not in acute distress.    Appearance: She is well-developed. She is not diaphoretic.  HENT:     Head: Normocephalic and atraumatic.     Mouth/Throat:     Pharynx: No oropharyngeal exudate.  Eyes:     Pupils: Pupils are equal, round, and reactive to light.  Neck:     Thyroid: No thyromegaly.     Vascular: No JVD.     Trachea: No tracheal deviation.  Cardiovascular:     Rate and Rhythm: Normal rate and regular rhythm.     Heart sounds: Normal heart sounds. No murmur heard.  No friction rub. No gallop.   Pulmonary:     Effort: Pulmonary effort is normal. No respiratory distress.     Breath sounds: Normal breath sounds. No wheezing or rales.  Chest:     Chest wall: No tenderness.  Abdominal:     Palpations: Abdomen is soft.      Tenderness: There is no abdominal tenderness. There is no guarding.  Musculoskeletal:        General: Normal range of motion.     Cervical back: Normal range of motion and neck supple.  Lymphadenopathy:     Cervical: No cervical adenopathy.  Skin:    General: Skin is warm and dry.  Neurological:     Mental Status: She is alert and oriented to person, place, and time.     Cranial Nerves: No cranial nerve deficit.  Psychiatric:        Behavior: Behavior normal.        Thought Content: Thought content normal.        Judgment:  Judgment normal.    Assessment/Plan: 1. Essential hypertension Stable, continue current medications.   2. Generalized anxiety disorder Overall doing well, continue current management.   3. Hypothyroidism, unspecified type continue synthroid.  4. BMI 29.0-29.9,adult Obesity Counseling: Risk Assessment: An assessment of behavioral risk factors was made today and includes lack of exercise sedentary lifestyle, lack of portion control and poor dietary habits.  Risk Modification Advice: She was counseled on portion control guidelines. Restricting daily caloric intake to 1500. The detrimental long term effects of obesity on her health and ongoing poor compliance was also discussed with the patient.  - phentermine 37.5 MG capsule; Take 1 capsule (37.5 mg total) by mouth every morning.  Dispense: 30 capsule; Refill: 0    General Counseling: Jayelle verbalizes understanding of the findings of todays visit and agrees with plan of treatment. I have discussed any further diagnostic evaluation that may be needed or ordered today. We also reviewed her medications today. she has been encouraged to call the office with any questions or concerns that should arise related to todays visit.    No orders of the defined types were placed in this encounter.   Meds ordered this encounter  Medications  . phentermine 37.5 MG capsule    Sig: Take 1 capsule (37.5 mg total) by mouth  every morning.    Dispense:  30 capsule    Refill:  0    Time spent: 30 Minutes   This patient was seen by Orson Gear AGNP-C in Collaboration with Dr Lavera Guise as a part of collaborative care agreement     Kendell Bane AGNP-C Internal medicine

## 2020-01-29 ENCOUNTER — Encounter: Payer: Self-pay | Admitting: Emergency Medicine

## 2020-01-29 ENCOUNTER — Other Ambulatory Visit: Payer: Self-pay

## 2020-01-29 ENCOUNTER — Emergency Department
Admission: EM | Admit: 2020-01-29 | Discharge: 2020-01-30 | Disposition: A | Discharge status: Home / Self Care | Payer: 59 | Attending: Emergency Medicine | Admitting: Emergency Medicine

## 2020-01-29 ENCOUNTER — Emergency Department: Payer: 59

## 2020-01-29 DIAGNOSIS — R1010 Upper abdominal pain, unspecified: Secondary | ICD-10-CM | POA: Diagnosis present

## 2020-01-29 DIAGNOSIS — Z9049 Acquired absence of other specified parts of digestive tract: Secondary | ICD-10-CM | POA: Insufficient documentation

## 2020-01-29 DIAGNOSIS — I10 Essential (primary) hypertension: Secondary | ICD-10-CM | POA: Insufficient documentation

## 2020-01-29 DIAGNOSIS — K5732 Diverticulitis of large intestine without perforation or abscess without bleeding: Secondary | ICD-10-CM | POA: Diagnosis not present

## 2020-01-29 DIAGNOSIS — Z90710 Acquired absence of both cervix and uterus: Secondary | ICD-10-CM | POA: Diagnosis not present

## 2020-01-29 DIAGNOSIS — E039 Hypothyroidism, unspecified: Secondary | ICD-10-CM | POA: Insufficient documentation

## 2020-01-29 DIAGNOSIS — Z79899 Other long term (current) drug therapy: Secondary | ICD-10-CM | POA: Diagnosis not present

## 2020-01-29 DIAGNOSIS — K6389 Other specified diseases of intestine: Secondary | ICD-10-CM | POA: Diagnosis not present

## 2020-01-29 DIAGNOSIS — K5792 Diverticulitis of intestine, part unspecified, without perforation or abscess without bleeding: Secondary | ICD-10-CM | POA: Diagnosis not present

## 2020-01-29 DIAGNOSIS — R000 Tachycardia, unspecified: Secondary | ICD-10-CM | POA: Diagnosis not present

## 2020-01-29 LAB — CBC
HCT: 38.3 % (ref 36.0–46.0)
Hemoglobin: 13.1 g/dL (ref 12.0–15.0)
MCH: 27.2 pg (ref 26.0–34.0)
MCHC: 34.2 g/dL (ref 30.0–36.0)
MCV: 79.6 fL — ABNORMAL LOW (ref 80.0–100.0)
Platelets: 292 10*3/uL (ref 150–400)
RBC: 4.81 MIL/uL (ref 3.87–5.11)
RDW: 13.2 % (ref 11.5–15.5)
WBC: 13.3 10*3/uL — ABNORMAL HIGH (ref 4.0–10.5)
nRBC: 0 % (ref 0.0–0.2)

## 2020-01-29 LAB — URINALYSIS, COMPLETE (UACMP) WITH MICROSCOPIC
Bacteria, UA: NONE SEEN
Bilirubin Urine: NEGATIVE
Glucose, UA: NEGATIVE mg/dL
Hgb urine dipstick: NEGATIVE
Ketones, ur: 5 mg/dL — AB
Leukocytes,Ua: NEGATIVE
Nitrite: NEGATIVE
Protein, ur: NEGATIVE mg/dL
Specific Gravity, Urine: 1.019 (ref 1.005–1.030)
Squamous Epithelial / HPF: NONE SEEN (ref 0–5)
pH: 6 (ref 5.0–8.0)

## 2020-01-29 LAB — COMPREHENSIVE METABOLIC PANEL
ALT: 28 U/L (ref 0–44)
AST: 21 U/L (ref 15–41)
Albumin: 4.6 g/dL (ref 3.5–5.0)
Alkaline Phosphatase: 75 U/L (ref 38–126)
Anion gap: 9 (ref 5–15)
BUN: 10 mg/dL (ref 6–20)
CO2: 27 mmol/L (ref 22–32)
Calcium: 9.1 mg/dL (ref 8.9–10.3)
Chloride: 100 mmol/L (ref 98–111)
Creatinine, Ser: 0.69 mg/dL (ref 0.44–1.00)
GFR calc Af Amer: >60 mL/min (ref >60)
GFR calc non Af Amer: >60 mL/min (ref >60)
Glucose, Bld: 102 mg/dL — ABNORMAL HIGH (ref 70–99)
Potassium: 2.9 mmol/L — ABNORMAL LOW (ref 3.5–5.1)
Sodium: 136 mmol/L (ref 135–145)
Total Bilirubin: 1 mg/dL (ref 0.3–1.2)
Total Protein: 7.9 g/dL (ref 6.5–8.1)

## 2020-01-29 LAB — LIPASE, BLOOD: Lipase: 25 U/L (ref 11–51)

## 2020-01-29 MED ORDER — SODIUM CHLORIDE 0.9% FLUSH: 3.0000 mL | Freq: Once | INTRAVENOUS | Status: DC

## 2020-01-29 MED ORDER — ONDANSETRON HCL 4 MG/2ML IJ SOLN
4.0000 mg | Freq: Once | INTRAMUSCULAR | Status: AC | PRN
  Administered 2020-01-29: 4 mg via INTRAVENOUS
  Filled 2020-01-29: qty 2

## 2020-01-29 MED ORDER — FENTANYL CITRATE (PF) 100 MCG/2ML IJ SOLN
50.0000 ug | INTRAMUSCULAR | Status: DC | PRN
  Administered 2020-01-29: 50 ug via INTRAVENOUS
  Filled 2020-01-29: qty 2

## 2020-01-29 MED ORDER — ONDANSETRON HCL 4 MG/2ML IJ SOLN
4.0000 mg | Freq: Once | INTRAMUSCULAR | Status: AC
  Administered 2020-01-29: 4 mg via INTRAVENOUS
  Filled 2020-01-29: qty 2

## 2020-01-29 MED ORDER — IOHEXOL 300 MG/ML  SOLN
100.0000 mL | Freq: Once | INTRAMUSCULAR | Status: AC | PRN
  Administered 2020-01-30: 100 mL via INTRAVENOUS

## 2020-01-29 MED ORDER — MORPHINE SULFATE (PF) 4 MG/ML IV SOLN
4.0000 mg | Freq: Once | INTRAVENOUS | Status: AC
  Administered 2020-01-29: 4 mg via INTRAVENOUS
  Filled 2020-01-29: qty 1

## 2020-01-29 NOTE — ED Triage Notes (Signed)
Pt to ED via POV c/o epigastric abdominal pain that radiates into the RLQ. Pt states that the pain started last night around 2300. Pt states that the pain has been constant since 2300. Pt denies any other symptoms. Pt appears uncomfortable at this time.

## 2020-01-29 NOTE — ED Notes (Signed)
Patient given update on wait time. Patient verbalizes understanding.  

## 2020-01-30 ENCOUNTER — Telehealth: Payer: Self-pay

## 2020-01-30 DIAGNOSIS — I10 Essential (primary) hypertension: Secondary | ICD-10-CM | POA: Diagnosis not present

## 2020-01-30 DIAGNOSIS — K6389 Other specified diseases of intestine: Secondary | ICD-10-CM | POA: Diagnosis not present

## 2020-01-30 DIAGNOSIS — Z79899 Other long term (current) drug therapy: Secondary | ICD-10-CM | POA: Diagnosis not present

## 2020-01-30 DIAGNOSIS — K5732 Diverticulitis of large intestine without perforation or abscess without bleeding: Secondary | ICD-10-CM | POA: Diagnosis not present

## 2020-01-30 DIAGNOSIS — Z9049 Acquired absence of other specified parts of digestive tract: Secondary | ICD-10-CM | POA: Diagnosis not present

## 2020-01-30 DIAGNOSIS — E039 Hypothyroidism, unspecified: Secondary | ICD-10-CM | POA: Diagnosis not present

## 2020-01-30 DIAGNOSIS — Z90710 Acquired absence of both cervix and uterus: Secondary | ICD-10-CM | POA: Diagnosis not present

## 2020-01-30 DIAGNOSIS — K5792 Diverticulitis of intestine, part unspecified, without perforation or abscess without bleeding: Secondary | ICD-10-CM | POA: Diagnosis not present

## 2020-01-30 MED ORDER — ACETAMINOPHEN 500 MG PO TABS
1000.0000 mg | ORAL_TABLET | Freq: Once | ORAL | Status: AC
  Administered 2020-01-30: 1000 mg via ORAL
  Filled 2020-01-30: qty 2

## 2020-01-30 MED ORDER — OXYCODONE HCL 5 MG PO TABS
5.0000 mg | ORAL_TABLET | Freq: Once | ORAL | Status: AC
  Administered 2020-01-30: 5 mg via ORAL
  Filled 2020-01-30: qty 1

## 2020-01-30 MED ORDER — OXYCODONE HCL 5 MG PO TABS: 5.0000 mg | ORAL_TABLET | Freq: Three times a day (TID) | ORAL | 0 refills | Status: DC | PRN

## 2020-01-30 MED ORDER — KETOROLAC TROMETHAMINE 30 MG/ML IJ SOLN
15.0000 mg | Freq: Once | INTRAMUSCULAR | Status: AC
  Administered 2020-01-30: 15 mg via INTRAVENOUS
  Filled 2020-01-30: qty 1

## 2020-01-30 MED ORDER — POTASSIUM CHLORIDE CRYS ER 20 MEQ PO TBCR
40.0000 meq | EXTENDED_RELEASE_TABLET | Freq: Once | ORAL | Status: AC
  Administered 2020-01-30: 40 meq via ORAL
  Filled 2020-01-30: qty 2

## 2020-01-30 MED ORDER — METRONIDAZOLE 500 MG PO TABS
500.0000 mg | ORAL_TABLET | Freq: Three times a day (TID) | ORAL | 0 refills | Status: AC
Start: 2020-01-30 — End: 2020-02-09

## 2020-01-30 MED ORDER — PIPERACILLIN-TAZOBACTAM 3.375 G IVPB 30 MIN
3.3750 g | Freq: Once | INTRAVENOUS | Status: AC
  Administered 2020-01-30: 3.375 g via INTRAVENOUS
  Filled 2020-01-30: qty 50

## 2020-01-30 MED ORDER — AMOXICILLIN-POT CLAVULANATE 875-125 MG PO TABS: 1.0000 | ORAL_TABLET | Freq: Two times a day (BID) | ORAL | 0 refills | Status: AC

## 2020-01-30 MED ORDER — ACETAMINOPHEN 500 MG PO TABS
1000.0000 mg | ORAL_TABLET | Freq: Three times a day (TID) | ORAL | 0 refills | Status: DC | PRN
Start: 2020-01-30 — End: 2020-04-06

## 2020-01-30 MED ORDER — ONDANSETRON 4 MG PO TBDP: 4.0000 mg | ORAL_TABLET | Freq: Three times a day (TID) | ORAL | 0 refills | Status: DC | PRN

## 2020-01-30 MED ORDER — LACTATED RINGERS IV BOLUS: 1000.0000 mL | Freq: Once | INTRAVENOUS | Status: DC

## 2020-01-30 NOTE — ED Provider Notes (Signed)
Emergency Department Provider Note  ____________________________________________  Time seen: Approximately 12:58 AM  I have reviewed the triage vital signs and the nursing notes.   HISTORY  Chief Complaint Abdominal Pain   HPI Yeraldine Lubinski is a 49 y.o. female with a history of hypothyroidism and uterine fibroids status post cholecystectomy, hysterectomy, salpingo-oophorectomy who presents for evaluation of abdominal pain.   Patient reports that she has been having pain for about 24 hours.  Initially started in the upper abdominal area but now is radiating into the right lower quadrant.  The pain is severe, sharp, constant, nonradiating.  Patient has had nausea but no vomiting, no diarrhea or constipation, no dysuria or hematuria, no chest pain or shortness of breath.  Past Medical History:  Diagnosis Date  . 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 Menorrhagia   . Uterine fibroid     Patient Active Problem List   Diagnosis Date Noted  . Multinodular thyroid 07/27/2019  . Unspecified menopausal and perimenopausal disorder 07/27/2019  . Generalized anxiety disorder 07/27/2019  . Essential hypertension 11/18/2018  . Acute posterior epistaxis 11/18/2018  . S/P laparoscopic assisted vaginal hysterectomy (LAVH) 11/01/2015  . Acquired autoimmune hypothyroidism 06/05/2014    Past Surgical History:  Procedure Laterality Date  . HYSTEROSCOPY WITH D & C  2007  . LAPAROSCOPIC CHOLECYSTECTOMY  Feb 1999  . LAPAROSCOPIC VAGINAL HYSTERECTOMY WITH SALPINGECTOMY Bilateral 11/01/2015   Procedure: LAPAROSCOPIC ASSISTED VAGINAL HYSTERECTOMY WITH SALPINGECTOMY;  Surgeon: Dian Queen, MD;  Location: [facility];  Service: Gynecology;  Laterality: Bilateral;  VAGINAL  . TONSILLECTOMY  1991  . TUBAL LIGATION  Mar 1999  . TYMPANOSTOMY TUBE PLACEMENT Bilateral  1980's    Prior to Admission medications   Medication Sig Start Date End Date Taking? Authorizing Provider  acetaminophen (TYLENOL) 500 MG tablet Take 2 tablets (1,000 mg total) by mouth every 8 (eight) hours as needed for mild pain, moderate pain, fever or headache. 01/30/20 01/29/21  Alfred Levins, [place], MD  acyclovir (ZOVIRAX) 400 MG tablet Take 1 tablet (400 mg total) by mouth 3 (three) times daily. 08/10/19   Ronnell Freshwater, NP  amLODipine (NORVASC) 10 MG tablet Take 1 tablet (10 mg total) by mouth daily. 11/24/19   Kendell Bane, NP  amoxicillin-clavulanate (AUGMENTIN) 875-125 MG tablet Take 1 tablet by mouth 2 (two) times daily for 10 days. 01/30/20 02/09/20  Rudene Re, MD  hydrochlorothiazide (HYDRODIURIL) 25 MG tablet Take 1 tablet (25 mg total) by mouth daily. 11/24/19   Kendell Bane, NP  levothyroxine (SYNTHROID) 150 MCG tablet Take 1 tablet (150 mcg total) by mouth daily before breakfast. 09/29/19   Philemon Kingdom, MD  metroNIDAZOLE (FLAGYL) 500 MG tablet Take 1 tablet (500 mg total) by mouth 3 (three) times daily for 10 days. 01/30/20 02/09/20  Rudene Re, MD  ondansetron (ZOFRAN ODT) 4 MG disintegrating tablet Take 1 tablet (4 mg total) by mouth every 8 (eight) hours as needed. 01/30/20   Rudene Re, MD  oxyCODONE (ROXICODONE) 5 MG immediate release tablet Take 1 tablet (5 mg total) by mouth every 8 (eight) hours as needed. 01/30/20 01/29/21  Rudene Re, MD  oxymetazoline (AFRIN) 0.05 % nasal spray Place 1 spray into both nostrils as needed for congestion.    [provider]  phentermine 37.5 MG capsule Take 1 capsule (37.5 mg total) by mouth every morning. 01/26/20   Kendell Bane, NP  Allergies Patient has no known allergies.  Family History  Problem Relation Age of Onset  . Cancer Mother   . Thyroid disease Paternal Grandmother   . Heart disease Paternal Grandmother   . Diabetes Neg Hx     Social History Social History    Tobacco Use  . Smoking status: Never Smoker  . Smokeless tobacco: Never Used  Substance Use Topics  . Alcohol use: Never  . Drug use: No    Review of Systems  Constitutional: Negative for fever. Eyes: Negative for visual changes. ENT: Negative for sore throat. Neck: No neck pain  Cardiovascular: Negative for chest pain. Respiratory: Negative for shortness of breath. Gastrointestinal: + abdominal pain and nausea. No vomiting or diarrhea. Genitourinary: Negative for dysuria. Musculoskeletal: Negative for back pain. Skin: Negative for rash. Neurological: Negative for headaches, weakness or numbness. Psych: No SI or HI  ____________________________________________   PHYSICAL EXAM:  VITAL SIGNS: ED Triage Vitals  Enc Vitals Group     BP 01/29/20 1755 (!) 163/82     Pulse Rate 01/29/20 1755 (!) 107     Resp 01/29/20 1755 16     Temp 01/29/20 1755 99 F (37.2 C)     Temp Source 01/29/20 1755 Oral     SpO2 01/29/20 1755 100 %     Weight 01/29/20 1756 167 lb (75.8 kg)     Height 01/29/20 1756 5\' 3"  (1.6 m)     Head Circumference --      Peak Flow --      Pain Score 01/29/20 1801 8     Pain Loc --      Pain Edu? --      Excl. in [place]? --     Constitutional: Alert and oriented, looks uncomfortable due to pain.  HEENT:      Head: Normocephalic and atraumatic.         Eyes: Conjunctivae are normal. Sclera is non-icteric.       Mouth/Throat: Mucous membranes are moist.       Neck: Supple with no signs of meningismus. Cardiovascular: Regular rate and rhythm. No murmurs, gallops, or rubs.  Respiratory: Normal respiratory effort. Lungs are clear to auscultation bilaterally.  Gastrointestinal: Soft, diffusely tender to palpation worse in the right lower quadrant, and non distended with positive bowel sounds. No rebound or guarding. Genitourinary: No CVA tenderness. Musculoskeletal: No edema, cyanosis, or erythema of extremities. Neurologic: Normal speech and language. Face  is symmetric. Moving all extremities. No gross focal neurologic deficits are appreciated. Skin: Skin is warm, dry and intact. No rash noted. Psychiatric: Mood and affect are normal. Speech and behavior are normal.  ____________________________________________   LABS (all labs ordered are listed, but only abnormal results are displayed)  Labs Reviewed  COMPREHENSIVE METABOLIC PANEL - Abnormal; Notable for the following components:      Result Value   Potassium 2.9 (*)    Glucose, Bld 102 (*)    All other components within normal limits  CBC - Abnormal; Notable for the following components:   WBC 13.3 (*)    MCV 79.6 (*)    All other components within normal limits  URINALYSIS, COMPLETE (UACMP) WITH MICROSCOPIC - Abnormal; Notable for the following components:   Color, Urine YELLOW (*)    APPearance HAZY (*)    Ketones, ur 5 (*)    All other components within normal limits  LIPASE, BLOOD   ____________________________________________  EKG  ED ECG REPORT I, Rudene Re, the attending physician, personally  viewed and interpreted this ECG.  Sinus tachycardia, rate of 120, normal intervals, normal axis, T wave inversions in inferior lateral leads with no ST elevation.  No prior for comparison. ____________________________________________  RADIOLOGY  I have personally reviewed the images performed during this visit and I agree with the Radiologist's read.   Interpretation by Radiologist:  CT ABDOMEN PELVIS W CONTRAST  Result Date: 01/30/2020 CLINICAL DATA:  Epigastric abdominal pain EXAM: CT ABDOMEN AND PELVIS WITH CONTRAST TECHNIQUE: Multidetector CT imaging of the abdomen and pelvis was performed using the standard protocol following bolus administration of intravenous contrast. CONTRAST:  119mL OMNIPAQUE IOHEXOL 300 MG/ML  SOLN COMPARISON:  07/22/2013 FINDINGS: LOWER CHEST: Normal. HEPATOBILIARY: Normal hepatic contours. No intra- or extrahepatic biliary dilatation.  Status post cholecystectomy. PANCREAS: Normal pancreas. No ductal dilatation or peripancreatic fluid collection. SPLEEN: Normal. ADRENALS/URINARY TRACT: The adrenal glands are normal. No hydronephrosis, nephroureterolithiasis or solid renal mass. The urinary bladder is normal for degree of distention STOMACH/BOWEL: There is no hiatal hernia. Normal duodenal course and caliber. No small bowel dilatation or inflammation. There is an area of inflammatory stranding in the right lower quadrant adjacent to the cecum with a central area of hyperdensity. This is most likely epiploic appendagitis. There is no free intraperitoneal air or fluid collection. Normal appendix. VASCULAR/LYMPHATIC: Normal course and caliber of the major abdominal vessels. No abdominal or pelvic lymphadenopathy. REPRODUCTIVE: Status post hysterectomy. No adnexal mass. MUSCULOSKELETAL. No bony spinal canal stenosis or focal osseous abnormality. OTHER: None. IMPRESSION: 1. Area of inflammatory stranding in the right lower quadrant adjacent to the cecum with central area of hyperdensity, most likely epiploic appendagitis. Focal diverticulitis of the cecum is also a possibility. 2. Normal appendix. 3. No free intraperitoneal air or fluid collection. Electronically Signed   By: Ulyses Jarred M.D.   On: 01/30/2020 00:49     ____________________________________________   PROCEDURES  Procedure(s) performed:yes .1-3 Lead EKG Interpretation Performed by: Rudene Re, MD Authorized by: Rudene Re, MD     Interpretation: non-specific     ECG rate assessment: tachycardic     Rhythm: sinus tachycardia     Ectopy: none     Critical Care performed:  None ____________________________________________   INITIAL IMPRESSION / ASSESSMENT AND PLAN / ED COURSE  49 y.o. female with a history of hypothyroidism and uterine fibroids status post cholecystectomy, hysterectomy, salpingo-oophorectomy who presents for evaluation of abdominal  pain.  Patient looks uncomfortable due to the pain, she is tender to palpation diffusely worse on the right lower quadrant with no rebound or guarding, no distention.  Slightly tachycardic but afebrile with normal blood pressure.  Differential diagnosis including appendicitis versus mesenteric adenitis versus epiploic appendagitis versus diverticulitis versus pancreatitis versus UTI versus kidney stone versus pyelonephritis.  UA negative for UTI.  Normal lipase and LFTs.  Mild hypokalemia supplemented orally with no EKG changes.  White count of 13.3.  CT visualized by me showing inflammatory changes in the right lower quadrant with a normal appendix, confirmed by radiology who thinks this is most likely epiploic appendagitis versus diverticulitis.  Therefore will treat with antibiotics.  Will give a dose of IV Zosyn.  Patient was given IV fentanyl, IV morphine.  Will give IV Toradol and switch to oral pain medication with Tylenol and oxycodone.  Old medical records reviewed.  Patient placed on telemetry for close monitoring.  Discussed my standard return precautions with patient for any signs of perforation or worsening inflammation.  Otherwise close follow-up with PCP.  Patient be discharged  home on Augmentin and Flagyl.      _____________________________________________ Please note:  Patient was evaluated in Emergency Department today for the symptoms described in the history of present illness. Patient was evaluated in the context of the global COVID-19 pandemic, which necessitated consideration that the patient might be at risk for infection with the SARS-CoV-2 virus that causes COVID-19. Institutional protocols and algorithms that pertain to the evaluation of patients at risk for COVID-19 are in a state of rapid change based on information released by regulatory bodies including the CDC and federal and state organizations. These policies and algorithms were followed during the patient's care in the  ED.  Some ED evaluations and interventions may be delayed as a result of limited staffing during the pandemic.   [place] Controlled Substance Database was reviewed by me. ____________________________________________   FINAL CLINICAL IMPRESSION(S) / ED DIAGNOSES   Final diagnoses:  Diverticulitis      NEW MEDICATIONS STARTED DURING THIS VISIT:  ED Discharge Orders         Ordered    oxyCODONE (ROXICODONE) 5 MG immediate release tablet  Every 8 hours PRN     Discontinue  Reprint     01/30/20 0112    acetaminophen (TYLENOL) 500 MG tablet  Every 8 hours PRN     Discontinue  Reprint     01/30/20 0112    ondansetron (ZOFRAN ODT) 4 MG disintegrating tablet  Every 8 hours PRN     Discontinue  Reprint     01/30/20 0112    amoxicillin-clavulanate (AUGMENTIN) 875-125 MG tablet  2 times daily     Discontinue  Reprint     01/30/20 0112    metroNIDAZOLE (FLAGYL) 500 MG tablet  3 times daily     Discontinue  Reprint     01/30/20 0112           Note:  This document was prepared using Dragon voice recognition software and may include unintentional dictation errors.    Alfred Levins, [place], MD 01/30/20 [phone]

## 2020-01-30 NOTE — ED Notes (Signed)
Pt to CT at this time.

## 2020-01-30 NOTE — Telephone Encounter (Signed)
Confirmed appointment on 02/01/2020 and screened for covid. klh 

## 2020-02-01 ENCOUNTER — Encounter: Payer: Self-pay | Admitting: Adult Health

## 2020-02-01 ENCOUNTER — Other Ambulatory Visit: Payer: Self-pay

## 2020-02-01 ENCOUNTER — Ambulatory Visit: Payer: 59 | Admitting: Adult Health

## 2020-02-01 VITALS — BP 132/78 | HR 81 | Temp 97.4°F | Resp 16 | Ht 63.0 in | Wt 167.8 lb

## 2020-02-01 DIAGNOSIS — K6389 Other specified diseases of intestine: Secondary | ICD-10-CM | POA: Diagnosis not present

## 2020-02-01 DIAGNOSIS — K5792 Diverticulitis of intestine, part unspecified, without perforation or abscess without bleeding: Secondary | ICD-10-CM

## 2020-02-01 DIAGNOSIS — I10 Essential (primary) hypertension: Secondary | ICD-10-CM | POA: Diagnosis not present

## 2020-02-01 NOTE — Progress Notes (Signed)
[facility] [place], [place] [address]  Internal MEDICINE  Office Visit Note  Patient Name: Brenda Schneider  [id number]  [id number]  Date of Service: 02/13/2020  Chief Complaint  Patient presents with  . Follow-up    ER f-up diverticulitis flare up sunday 01/29/20    HPI  Pt reports on Saturday night she woke up from her sleep with abdominal pain. The pain radiated from left to right side of her abdomen. She tolerated it for awhile, and then went to the ER. Her Ct showed epiploic appendagitis and possibly diverticulitis of the cecum.  She has been taking flagyl and amoxicillin.  She has been on clear liquid diet, and she reports the pain is some better today.   Her potassium was low at 2.9, and will need to be rechecked.    Current Medication: Outpatient Encounter Medications as of 02/01/2020  Medication Sig  . acetaminophen (TYLENOL) 500 MG tablet Take 2 tablets (1,000 mg total) by mouth every 8 (eight) hours as needed for mild pain, moderate pain, fever or headache.  Marland Kitchen acyclovir (ZOVIRAX) 400 MG tablet Take 1 tablet (400 mg total) by mouth 3 (three) times daily.  Marland Kitchen amLODipine (NORVASC) 10 MG tablet Take 1 tablet (10 mg total) by mouth daily.  . [EXPIRED] amoxicillin-clavulanate (AUGMENTIN) 875-125 MG tablet Take 1 tablet by mouth 2 (two) times daily for 10 days.  . hydrochlorothiazide (HYDRODIURIL) 25 MG tablet Take 1 tablet (25 mg total) by mouth daily.  Marland Kitchen levothyroxine (SYNTHROID) 150 MCG tablet Take 1 tablet (150 mcg total) by mouth daily before breakfast.  . [EXPIRED] metroNIDAZOLE (FLAGYL) 500 MG tablet Take 1 tablet (500 mg total) by mouth 3 (three) times daily for 10 days.  . ondansetron (ZOFRAN ODT) 4 MG disintegrating tablet Take 1 tablet (4 mg total) by mouth every 8 (eight) hours as needed.  Marland Kitchen oxyCODONE (ROXICODONE) 5 MG immediate release tablet Take 1 tablet (5 mg total) by mouth every 8 (eight) hours as needed.  Marland Kitchen oxymetazoline (AFRIN) 0.05 %  nasal spray Place 1 spray into both nostrils as needed for congestion.  . phentermine 37.5 MG capsule Take 1 capsule (37.5 mg total) by mouth every morning.   No facility-administered encounter medications on file as of 02/01/2020.    Surgical History: Past Surgical History:  Procedure Laterality Date  . HYSTEROSCOPY WITH D & C  2007  . LAPAROSCOPIC CHOLECYSTECTOMY  Feb 1999  . LAPAROSCOPIC VAGINAL HYSTERECTOMY WITH SALPINGECTOMY Bilateral 11/01/2015   Procedure: LAPAROSCOPIC ASSISTED VAGINAL HYSTERECTOMY WITH SALPINGECTOMY;  Surgeon: Dian Queen, MD;  Location: [facility];  Service: Gynecology;  Laterality: Bilateral;  VAGINAL  . TONSILLECTOMY  1991  . TUBAL LIGATION  Mar 1999  . TYMPANOSTOMY TUBE PLACEMENT Bilateral 1980's    Medical History: Past Medical History:  Diagnosis Date  . 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 Menorrhagia   . Uterine fibroid     Family History: Family History  Problem Relation Age of Onset  . Cancer Mother   . Thyroid disease Paternal Grandmother   . Heart disease Paternal Grandmother   . Diabetes Neg Hx     Social History   Socioeconomic History  . Marital status: Married    Spouse name: Not on file  . Number of children: Not on file  . Years of education: Not on file  . Highest education level: Not on file  Occupational History  .  Not on file  Tobacco Use  . Smoking status: Never Smoker  . Smokeless tobacco: Never Used  Substance and Sexual Activity  . Alcohol use: Never  . Drug use: No  . Sexual activity: Not on file  Other Topics Concern  . Not on file  Social History Narrative  . Not on file   Social Determinants of Health   Financial Resource Strain:   . Difficulty of Paying Living Expenses:   Food Insecurity:   . Worried About Charity fundraiser in the Last Year:   . Arboriculturist in the Last Year:   Transportation  Needs:   . Film/video editor (Medical):   Marland Kitchen Lack of Transportation (Non-Medical):   Physical Activity:   . Days of Exercise per Week:   . Minutes of Exercise per Session:   Stress:   . Feeling of Stress :   Social Connections:   . Frequency of Communication with Friends and Family:   . Frequency of Social Gatherings with Friends and Family:   . Attends Religious Services:   . Active Member of Clubs or Organizations:   . Attends Archivist Meetings:   Marland Kitchen Marital Status:   Intimate Partner Violence:   . Fear of Current or Ex-Partner:   . Emotionally Abused:   Marland Kitchen Physically Abused:   . Sexually Abused:       Review of Systems  Constitutional: Negative for chills, fatigue and unexpected weight change.  HENT: Negative for congestion, rhinorrhea, sneezing and sore throat.   Eyes: Negative for photophobia, pain and redness.  Respiratory: Negative for cough, chest tightness and shortness of breath.   Cardiovascular: Negative for chest pain and palpitations.  Gastrointestinal: Negative for abdominal pain, constipation, diarrhea, nausea and vomiting.  Endocrine: Negative.   Genitourinary: Negative for dysuria and frequency.  Musculoskeletal: Negative for arthralgias, back pain, joint swelling and neck pain.  Skin: Negative for rash.  Allergic/Immunologic: Negative.   Neurological: Negative for tremors and numbness.  Hematological: Negative for adenopathy. Does not bruise/bleed easily.  Psychiatric/Behavioral: Negative for behavioral problems and sleep disturbance. The patient is not nervous/anxious.     Vital Signs: BP 132/78   Pulse 81   Temp (!) 97.4 F (36.3 C)   Resp 16   Ht 5\' 3"  (1.6 m)   Wt 167 lb 12.8 oz (76.1 kg)   LMP 10/08/2015 (Exact Date)   SpO2 99%   BMI 29.72 kg/m    Physical Exam Vitals and nursing note reviewed.  Constitutional:      General: She is not in acute distress.    Appearance: She is well-developed. She is not diaphoretic.   HENT:     Head: Normocephalic and atraumatic.     Mouth/Throat:     Pharynx: No oropharyngeal exudate.  Eyes:     Pupils: Pupils are equal, round, and reactive to light.  Neck:     Thyroid: No thyromegaly.     Vascular: No JVD.     Trachea: No tracheal deviation.  Cardiovascular:     Rate and Rhythm: Normal rate and regular rhythm.     Heart sounds: Normal heart sounds. No murmur heard.  No friction rub. No gallop.   Pulmonary:     Effort: Pulmonary effort is normal. No respiratory distress.     Breath sounds: Normal breath sounds. No wheezing or rales.  Chest:     Chest wall: No tenderness.  Abdominal:     Palpations: Abdomen is soft.  Tenderness: There is no abdominal tenderness. There is no guarding.  Musculoskeletal:        General: Normal range of motion.     Cervical back: Normal range of motion and neck supple.  Lymphadenopathy:     Cervical: No cervical adenopathy.  Skin:    General: Skin is warm and dry.  Neurological:     Mental Status: She is alert and oriented to person, place, and time.     Cranial Nerves: No cranial nerve deficit.  Psychiatric:        Behavior: Behavior normal.        Thought Content: Thought content normal.        Judgment: Judgment normal.    Assessment/Plan: 1. Diverticulitis Recheck Potassium and CBC.  Pt continues to be fatigued and have abd pain. Continue to advance diet as tolerated.  - Potassium - CBC w/Diff/Platelet  2. Epiploic appendagitis Ongoing pain, complete antibiotics  3. Essential hypertension Controlled, continue current management.   General Counseling: Allani verbalizes understanding of the findings of todays visit and agrees with plan of treatment. I have discussed any further diagnostic evaluation that may be needed or ordered today. We also reviewed her medications today. she has been encouraged to call the office with any questions or concerns that should arise related to todays visit.    Orders Placed  This Encounter  Procedures  . Potassium  . CBC w/Diff/Platelet    No orders of the defined types were placed in this encounter.   Time spent:30 Minutes   This patient was seen by Orson Gear AGNP-C in Collaboration with Dr Lavera Guise as a part of collaborative care agreement     Kendell Bane AGNP-C Internal medicine

## 2020-02-02 ENCOUNTER — Encounter: Payer: Self-pay | Admitting: Nurse Practitioner

## 2020-02-02 LAB — CBC WITH DIFFERENTIAL/PLATELET
Basophils Absolute: 0 10*3/uL (ref 0.0–0.2)
Basos: 1 %
EOS (ABSOLUTE): 0.1 10*3/uL (ref 0.0–0.4)
Eos: 1 %
Hematocrit: 38.6 % (ref 34.0–46.6)
Hemoglobin: 12.4 g/dL (ref 11.1–15.9)
Immature Grans (Abs): 0 10*3/uL (ref 0.0–0.1)
Immature Granulocytes: 0 %
Lymphocytes Absolute: 0.9 10*3/uL (ref 0.7–3.1)
Lymphs: 16 %
MCH: 26.2 pg — ABNORMAL LOW (ref 26.6–33.0)
MCHC: 32.1 g/dL (ref 31.5–35.7)
MCV: 82 fL (ref 79–97)
Monocytes Absolute: 0.4 10*3/uL (ref 0.1–0.9)
Monocytes: 6 %
Neutrophils Absolute: 4.5 10*3/uL (ref 1.4–7.0)
Neutrophils: 76 %
Platelets: 277 10*3/uL (ref 150–450)
RBC: 4.73 x10E6/uL (ref 3.77–5.28)
RDW: 13.1 % (ref 11.7–15.4)
WBC: 5.9 10*3/uL (ref 3.4–10.8)

## 2020-02-02 LAB — POTASSIUM: Potassium: 3.7 mmol/L (ref 3.5–5.2)

## 2020-02-02 NOTE — Telephone Encounter (Signed)
Hey. I got this message that is written to you.

## 2020-02-03 ENCOUNTER — Telehealth: Payer: Self-pay

## 2020-02-03 NOTE — Telephone Encounter (Signed)
Spoke to pt and advised per Quita Skye that her potassium was normal and that with her WBC if she was put on antibiotics for her to finish taking them and her nausea and fatigue would get better very soon.  Pt asked if she could have note for work to be out another week due to still being very nauseous and fatigue with the antibiotics and note was given and faxed to ATTN: Mingo Amber at [phone] per Quita Skye.  dbs

## 2020-02-09 ENCOUNTER — Telehealth: Payer: Self-pay

## 2020-02-09 NOTE — Telephone Encounter (Signed)
Left a message and asked pt to call back regarding disability paperwork. Beth

## 2020-02-13 ENCOUNTER — Telehealth: Payer: Self-pay

## 2020-02-13 NOTE — Progress Notes (Signed)
Scanned in patient disability form. beth

## 2020-02-13 NOTE — Telephone Encounter (Signed)
Patient advised paperwork is ready for pick up. Brenda Schneider

## 2020-02-21 ENCOUNTER — Telehealth: Payer: Self-pay

## 2020-02-21 NOTE — Telephone Encounter (Signed)
Lmom to confirm and screen for 02-23-20 ov. 

## 2020-02-23 ENCOUNTER — Encounter: Payer: Self-pay | Admitting: Adult Health

## 2020-02-23 ENCOUNTER — Other Ambulatory Visit: Payer: Self-pay

## 2020-02-23 ENCOUNTER — Ambulatory Visit: Payer: 59 | Admitting: Adult Health

## 2020-02-23 VITALS — BP 148/86 | HR 89 | Temp 97.9°F | Resp 16 | Ht 63.0 in | Wt 162.0 lb

## 2020-02-23 DIAGNOSIS — F411 Generalized anxiety disorder: Secondary | ICD-10-CM

## 2020-02-23 DIAGNOSIS — Z6828 Body mass index (BMI) 28.0-28.9, adult: Secondary | ICD-10-CM

## 2020-02-23 DIAGNOSIS — K6389 Other specified diseases of intestine: Secondary | ICD-10-CM | POA: Diagnosis not present

## 2020-02-23 DIAGNOSIS — I10 Essential (primary) hypertension: Secondary | ICD-10-CM | POA: Diagnosis not present

## 2020-02-23 NOTE — Progress Notes (Signed)
[facility] [place], [place] [address]  Internal MEDICINE  Office Visit Note  Patient Name: Brenda Schneider  [id number]  [id number]  Date of Service: 02/23/2020  Chief Complaint  Patient presents with  . Follow-up    weight loss   HPI  PT is here for follow up. On July 11th, she was in the ED and found to have epiploic appendagitise.  She continues to have some RLQ bloating, nausea and some pain.  She has not followed up with GI at this point.  She has experimented with her diet and even doesnt the [employer] for some time.    Current Medication: Outpatient Encounter Medications as of 02/23/2020  Medication Sig  . acetaminophen (TYLENOL) 500 MG tablet Take 2 tablets (1,000 mg total) by mouth every 8 (eight) hours as needed for mild pain, moderate pain, fever or headache.  Marland Kitchen acyclovir (ZOVIRAX) 400 MG tablet Take 1 tablet (400 mg total) by mouth 3 (three) times daily.  Marland Kitchen amLODipine (NORVASC) 10 MG tablet Take 1 tablet (10 mg total) by mouth daily.  . hydrochlorothiazide (HYDRODIURIL) 25 MG tablet Take 1 tablet (25 mg total) by mouth daily.  Marland Kitchen levothyroxine (SYNTHROID) 150 MCG tablet Take 1 tablet (150 mcg total) by mouth daily before breakfast.  . oxymetazoline (AFRIN) 0.05 % nasal spray Place 1 spray into both nostrils as needed for congestion.  . phentermine 37.5 MG capsule Take 1 capsule (37.5 mg total) by mouth every morning.  . [DISCONTINUED] ondansetron (ZOFRAN ODT) 4 MG disintegrating tablet Take 1 tablet (4 mg total) by mouth every 8 (eight) hours as needed.  . [DISCONTINUED] oxyCODONE (ROXICODONE) 5 MG immediate release tablet Take 1 tablet (5 mg total) by mouth every 8 (eight) hours as needed.   No facility-administered encounter medications on file as of 02/23/2020.    Surgical History: Past Surgical History:  Procedure Laterality Date  . HYSTEROSCOPY WITH D & C  2007  . LAPAROSCOPIC CHOLECYSTECTOMY  Feb 1999  . LAPAROSCOPIC VAGINAL HYSTERECTOMY  WITH SALPINGECTOMY Bilateral 11/01/2015   Procedure: LAPAROSCOPIC ASSISTED VAGINAL HYSTERECTOMY WITH SALPINGECTOMY;  Surgeon: Dian Queen, MD;  Location: [place];  Service: Gynecology;  Laterality: Bilateral;  VAGINAL  . TONSILLECTOMY  1991  . TUBAL LIGATION  Mar 1999  . TYMPANOSTOMY TUBE PLACEMENT Bilateral 1980's    Medical History: Past Medical History:  Diagnosis Date  . 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 Menorrhagia   . Uterine fibroid     Family History: Family History  Problem Relation Age of Onset  . Cancer Mother   . Thyroid disease Paternal Grandmother   . Heart disease Paternal Grandmother   . Diabetes Neg Hx     Social History   Socioeconomic History  . Marital status: Married    Spouse name: Not on file  . Number of children: Not on file  . Years of education: Not on file  . Highest education level: Not on file  Occupational History  . Not on file  Tobacco Use  . Smoking status: Never Smoker  . Smokeless tobacco: Never Used  Substance and Sexual Activity  . Alcohol use: Never  . Drug use: No  . Sexual activity: Not on file  Other Topics Concern  . Not on file  Social History Narrative  . Not on file   Social Determinants of Health   Financial Resource Strain:   . Difficulty  of Paying Living Expenses:   Food Insecurity:   . Worried About Charity fundraiser in the Last Year:   . Arboriculturist in the Last Year:   Transportation Needs:   . Film/video editor (Medical):   Marland Kitchen Lack of Transportation (Non-Medical):   Physical Activity:   . Days of Exercise per Week:   . Minutes of Exercise per Session:   Stress:   . Feeling of Stress :   Social Connections:   . Frequency of Communication with Friends and Family:   . Frequency of Social Gatherings with Friends and Family:   . Attends Religious Services:   . Active Member of Clubs or  Organizations:   . Attends Archivist Meetings:   Marland Kitchen Marital Status:   Intimate Partner Violence:   . Fear of Current or Ex-Partner:   . Emotionally Abused:   Marland Kitchen Physically Abused:   . Sexually Abused:       Review of Systems  Constitutional: Negative for chills, fatigue and unexpected weight change.  HENT: Negative for congestion, rhinorrhea, sneezing and sore throat.   Eyes: Negative for photophobia, pain and redness.  Respiratory: Negative for cough, chest tightness and shortness of breath.   Cardiovascular: Negative for chest pain and palpitations.  Gastrointestinal: Negative for abdominal pain, constipation, diarrhea, nausea and vomiting.  Endocrine: Negative.   Genitourinary: Negative for dysuria and frequency.  Musculoskeletal: Negative for arthralgias, back pain, joint swelling and neck pain.  Skin: Negative for rash.  Allergic/Immunologic: Negative.   Neurological: Negative for tremors and numbness.  Hematological: Negative for adenopathy. Does not bruise/bleed easily.  Psychiatric/Behavioral: Negative for behavioral problems and sleep disturbance. The patient is not nervous/anxious.     Vital Signs: BP (!) 152/85   Pulse 89   Temp 97.9 F (36.6 C)   Resp 16   Ht 5\' 3"  (1.6 m)   Wt 162 lb (73.5 kg)   LMP 10/08/2015 (Exact Date)   SpO2 100%   BMI 28.70 kg/m    Physical Exam Vitals and nursing note reviewed.  Constitutional:      General: She is not in acute distress.    Appearance: She is well-developed. She is not diaphoretic.  HENT:     Head: Normocephalic and atraumatic.     Mouth/Throat:     Pharynx: No oropharyngeal exudate.  Eyes:     Pupils: Pupils are equal, round, and reactive to light.  Neck:     Thyroid: No thyromegaly.     Vascular: No JVD.     Trachea: No tracheal deviation.  Cardiovascular:     Rate and Rhythm: Normal rate and regular rhythm.     Heart sounds: Normal heart sounds. No murmur heard.  No friction rub. No gallop.    Pulmonary:     Effort: Pulmonary effort is normal. No respiratory distress.     Breath sounds: Normal breath sounds. No wheezing or rales.  Chest:     Chest wall: No tenderness.  Abdominal:     Palpations: Abdomen is soft.     Tenderness: There is no abdominal tenderness. There is no guarding.  Musculoskeletal:        General: Normal range of motion.     Cervical back: Normal range of motion and neck supple.  Lymphadenopathy:     Cervical: No cervical adenopathy.  Skin:    General: Skin is warm and dry.  Neurological:     Mental Status: She is alert and oriented to  person, place, and time.     Cranial Nerves: No cranial nerve deficit.  Psychiatric:        Behavior: Behavior normal.        Thought Content: Thought content normal.        Judgment: Judgment normal.    Assessment/Plan: 1. Epiploic appendagitis Will follow up in 4 more weeks, continue diet, and will discuss GI consult at next visit.   2. Essential hypertension Continue to follow.   3. Generalized anxiety disorder Stable current management.  4. BMI 28.0-28.9,adult Pt has lost 5 pounds, will continue diet.   General Counseling: Jaye verbalizes understanding of the findings of todays visit and agrees with plan of treatment. I have discussed any further diagnostic evaluation that may be needed or ordered today. We also reviewed her medications today. she has been encouraged to call the office with any questions or concerns that should arise related to todays visit.    No orders of the defined types were placed in this encounter.   No orders of the defined types were placed in this encounter.   Time spent: 30 Minutes   This patient was seen by Orson Gear AGNP-C in Collaboration with Dr Lavera Guise as a part of collaborative care agreement     Kendell Bane AGNP-C Internal medicine

## 2020-02-28 ENCOUNTER — Other Ambulatory Visit: Payer: Self-pay | Admitting: Internal Medicine

## 2020-02-28 ENCOUNTER — Other Ambulatory Visit: Payer: Self-pay

## 2020-02-28 DIAGNOSIS — E063 Autoimmune thyroiditis: Secondary | ICD-10-CM

## 2020-02-28 MED ORDER — LEVOTHYROXINE SODIUM 150 MCG PO TABS: 150.0000 ug | ORAL_TABLET | Freq: Every day | ORAL | 3 refills | Status: DC

## 2020-03-01 ENCOUNTER — Telehealth: Payer: Self-pay

## 2020-03-01 NOTE — Telephone Encounter (Signed)
Put copy of patient disability papers up front for pick up. Brenda Schneider

## 2020-03-30 ENCOUNTER — Encounter: Payer: Self-pay | Admitting: Adult Health

## 2020-03-30 ENCOUNTER — Ambulatory Visit: Payer: 59 | Admitting: Adult Health

## 2020-03-30 VITALS — BP 152/80 | HR 68 | Temp 97.8°F | Resp 16 | Ht 63.0 in | Wt 164.0 lb

## 2020-03-30 DIAGNOSIS — F411 Generalized anxiety disorder: Secondary | ICD-10-CM | POA: Diagnosis not present

## 2020-03-30 DIAGNOSIS — K6389 Other specified diseases of intestine: Secondary | ICD-10-CM

## 2020-03-30 DIAGNOSIS — Z6829 Body mass index (BMI) 29.0-29.9, adult: Secondary | ICD-10-CM

## 2020-03-30 DIAGNOSIS — I10 Essential (primary) hypertension: Secondary | ICD-10-CM

## 2020-03-30 MED ORDER — HYDROCHLOROTHIAZIDE 25 MG PO TABS: 25.0000 mg | ORAL_TABLET | Freq: Every day | ORAL | 1 refills | Status: DC

## 2020-03-30 MED ORDER — PHENTERMINE HCL 37.5 MG PO CAPS: 37.5000 mg | ORAL_CAPSULE | ORAL | 0 refills | Status: DC

## 2020-03-30 MED ORDER — AMLODIPINE BESYLATE 10 MG PO TABS: 10.0000 mg | ORAL_TABLET | Freq: Every day | ORAL | 1 refills | Status: DC

## 2020-03-30 NOTE — Progress Notes (Signed)
[facility] [place], [place] [address]  Internal MEDICINE  Office Visit Note  Patient Name: Brenda Schneider  [id number]  [id number]  Date of Service: 03/30/2020  Chief Complaint  Patient presents with   Follow-up   Quality Metric Gaps    HepC, HIV, TDAP, pap   Hypertension    HPI  Pt is here for follow up.  She reports in the last 4 weeks her abdominal pain as improved significantly.  She continues to have some intermittent RLQ pain.  The pains are completely random, and she can not connect them to activity or food. Each episode is very infrequent, and lasts less than an hour.  She is taking motrin/tylenol and using a heating pad as needed. Overall she is much better, and feels like the episodes are getting less frequent.    Current Medication: Outpatient Encounter Medications as of 03/30/2020  Medication Sig   acyclovir (ZOVIRAX) 400 MG tablet Take 1 tablet (400 mg total) by mouth 3 (three) times daily.   amLODipine (NORVASC) 10 MG tablet Take 1 tablet (10 mg total) by mouth daily.   hydrochlorothiazide (HYDRODIURIL) 25 MG tablet Take 1 tablet (25 mg total) by mouth daily.   levothyroxine (SYNTHROID) 150 MCG tablet Take 1 tablet (150 mcg total) by mouth daily before breakfast.   oxymetazoline (AFRIN) 0.05 % nasal spray Place 1 spray into both nostrils as needed for congestion.   phentermine 37.5 MG capsule Take 1 capsule (37.5 mg total) by mouth every morning.   [DISCONTINUED] amLODipine (NORVASC) 10 MG tablet Take 1 tablet (10 mg total) by mouth daily.   [DISCONTINUED] hydrochlorothiazide (HYDRODIURIL) 25 MG tablet Take 1 tablet (25 mg total) by mouth daily.   acetaminophen (TYLENOL) 500 MG tablet Take 2 tablets (1,000 mg total) by mouth every 8 (eight) hours as needed for mild pain, moderate pain, fever or headache.   phentermine 37.5 MG capsule Take 1 capsule (37.5 mg total) by mouth every morning.   No facility-administered encounter  medications on file as of 03/30/2020.    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   secondary to hashimoto thyroiditis-- followed by dr Dwyane Dee   Hypertension    Menorrhagia    Uterine fibroid     Family History: Family History  Problem Relation Age of Onset   Cancer Mother    Thyroid disease Paternal Grandmother    Heart disease Paternal Grandmother    Diabetes Neg Hx     Social History   Socioeconomic History   Marital status: Married    Spouse name: Not on file   Number of children: Not on file   Years of education: Not on file   Highest education level: Not on file  Occupational History   Not on file  Tobacco Use   Smoking status: Never Smoker   Smokeless tobacco: Never Used  Substance and Sexual Activity   Alcohol use: Never   Drug use: No  Sexual activity: Not on file  Other Topics Concern   Not on file  Social History Narrative   Not on file   Social Determinants of Health   Financial Resource Strain:    Difficulty of Paying Living Expenses: Not on file  Food Insecurity:    Worried About [place] in the Last Year: Not on file   Ran Out of Food in the Last Year: Not on file  Transportation Needs:    Lack of Transportation (Medical): Not on file   Lack of Transportation (Non-Medical): Not on file  Physical Activity:    Days of  Exercise per Week: Not on file   Minutes of Exercise per Session: Not on file  Stress:    Feeling of Stress : Not on file  Social Connections:    Frequency of Communication with Friends and Family: Not on file   Frequency of Social Gatherings with Friends and Family: Not on file   Attends Religious Services: Not on file   Active Member of Clubs or Organizations: Not on file   Attends Archivist Meetings: Not on file   Marital Status: Not on file  Intimate Partner Violence:    Fear of Current or Ex-Partner: Not on file   Emotionally Abused: Not on file   Physically Abused: Not on file   Sexually Abused: Not on file      Review of Systems  Constitutional: Negative for chills, fatigue and unexpected weight change.  HENT: Negative for congestion, rhinorrhea, sneezing and sore throat.   Eyes: Negative for photophobia, pain and redness.  Respiratory: Negative for cough, chest tightness and shortness of breath.   Cardiovascular: Negative for chest pain and palpitations.  Gastrointestinal: Negative for abdominal pain, constipation, diarrhea, nausea and vomiting.  Endocrine: Negative.   Genitourinary: Negative for dysuria and frequency.  Musculoskeletal: Negative for arthralgias, back pain, joint swelling and neck pain.  Skin: Negative for rash.  Allergic/Immunologic: Negative.   Neurological: Negative for tremors and numbness.  Hematological: Negative for adenopathy. Does not bruise/bleed easily.  Psychiatric/Behavioral: Negative for behavioral problems and sleep disturbance. The patient is not nervous/anxious.     Vital Signs: BP (!) 152/80    Pulse 68    Temp 97.8 F (36.6 C)    Resp 16    Ht 5\' 3"  (1.6 m)    Wt 164 lb (74.4 kg)    LMP 10/08/2015 (Exact Date)    SpO2 98%    BMI 29.05 kg/m    Physical Exam Vitals and nursing note reviewed.  Constitutional:      General: She is not in acute distress.    Appearance: She is well-developed. She is not  diaphoretic.  HENT:     Head: Normocephalic and atraumatic.     Mouth/Throat:     Pharynx: No oropharyngeal exudate.  Eyes:     Pupils: Pupils are equal, round, and reactive to light.  Neck:     Thyroid: No thyromegaly.     Vascular: No JVD.     Trachea: No tracheal deviation.  Cardiovascular:     Rate and Rhythm: Normal rate and regular rhythm.     Heart sounds: Normal heart sounds. No murmur heard.  No friction rub. No gallop.   Pulmonary:     Effort: Pulmonary effort is normal. No respiratory distress.     Breath sounds: Normal breath sounds. No wheezing or rales.  Chest:     Chest wall: No tenderness.  Abdominal:  Palpations: Abdomen is soft.     Tenderness: There is no abdominal tenderness. There is no guarding.  Musculoskeletal:        General: Normal range of motion.     Cervical back: Normal range of motion and neck supple.  Lymphadenopathy:     Cervical: No cervical adenopathy.  Skin:    General: Skin is warm and dry.  Neurological:     Mental Status: She is alert and oriented to person, place, and time.     Cranial Nerves: No cranial nerve deficit.  Psychiatric:        Behavior: Behavior normal.        Thought Content: Thought content normal.        Judgment: Judgment normal.    Assessment/Plan: 1. Epiploic appendagitis Resolving, and improving.  Continue to monitor.  Pt declined gi consult at this time.   2. Essential hypertension Continue to follow bp. Stable currently.   3. Generalized anxiety disorder Stable, continue present management.  General Counseling: Addisynn verbalizes understanding of the findings of todays visit and agrees with plan of treatment. I have discussed any further diagnostic evaluation that may be needed or ordered today. We also reviewed her medications today. she has been encouraged to call the office with any questions or concerns that should arise related to todays visit.    No orders of the defined types were placed in this  encounter.   Meds ordered this encounter  Medications   phentermine 37.5 MG capsule    Sig: Take 1 capsule (37.5 mg total) by mouth every morning.    Dispense:  30 capsule    Refill:  0   amLODipine (NORVASC) 10 MG tablet    Sig: Take 1 tablet (10 mg total) by mouth daily.    Dispense:  90 tablet    Refill:  1   hydrochlorothiazide (HYDRODIURIL) 25 MG tablet    Sig: Take 1 tablet (25 mg total) by mouth daily.    Dispense:  90 tablet    Refill:  1    Time spent: 25 Minutes   This patient was seen by Orson Gear AGNP-C in Collaboration with Dr Lavera Guise as a part of collaborative care agreement     Kendell Bane AGNP-C Internal medicine

## 2020-04-04 ENCOUNTER — Other Ambulatory Visit: Payer: Self-pay | Admitting: Internal Medicine

## 2020-04-04 ENCOUNTER — Other Ambulatory Visit (INDEPENDENT_AMBULATORY_CARE_PROVIDER_SITE_OTHER): Payer: 59

## 2020-04-04 DIAGNOSIS — E063 Autoimmune thyroiditis: Secondary | ICD-10-CM | POA: Diagnosis not present

## 2020-04-04 DIAGNOSIS — E038 Other specified hypothyroidism: Secondary | ICD-10-CM | POA: Diagnosis not present

## 2020-04-04 LAB — TSH: TSH: 7.85 u[IU]/mL — ABNORMAL HIGH (ref 0.35–4.50)

## 2020-04-04 LAB — T4, FREE: Free T4: 0.97 ng/dL (ref 0.60–1.60)

## 2020-04-05 ENCOUNTER — Encounter: Payer: Self-pay | Admitting: Internal Medicine

## 2020-04-05 LAB — THYROGLOBULIN ANTIBODY: Thyroglobulin Ab: 3 IU/mL — ABNORMAL HIGH (ref <=1)

## 2020-04-05 LAB — THYROID PEROXIDASE ANTIBODY: Thyroperoxidase Ab SerPl-aCnc: 169 IU/mL — ABNORMAL HIGH (ref <9)

## 2020-04-06 ENCOUNTER — Encounter: Payer: Self-pay | Admitting: Internal Medicine

## 2020-04-06 ENCOUNTER — Ambulatory Visit (INDEPENDENT_AMBULATORY_CARE_PROVIDER_SITE_OTHER): Payer: 59 | Admitting: Internal Medicine

## 2020-04-06 ENCOUNTER — Other Ambulatory Visit: Payer: Self-pay

## 2020-04-06 ENCOUNTER — Ambulatory Visit: Payer: 59 | Admitting: Internal Medicine

## 2020-04-06 VITALS — BP 154/92 | HR 96 | Ht 63.0 in | Wt 160.0 lb

## 2020-04-06 DIAGNOSIS — E041 Nontoxic single thyroid nodule: Secondary | ICD-10-CM | POA: Diagnosis not present

## 2020-04-06 DIAGNOSIS — E038 Other specified hypothyroidism: Secondary | ICD-10-CM

## 2020-04-06 DIAGNOSIS — E063 Autoimmune thyroiditis: Secondary | ICD-10-CM

## 2020-04-06 MED ORDER — LEVOTHYROXINE SODIUM 150 MCG PO TABS: 150.0000 ug | ORAL_TABLET | ORAL | 3 refills | Status: DC

## 2020-04-06 NOTE — Patient Instructions (Signed)
Please start alternating levothyroxine 150 with 175 mcg every other day.  Take the thyroid hormone every day, with water, at least 30 minutes before breakfast, separated by at least 4 hours from: - acid reflux medications - calcium - iron - multivitamins  Please come back for labs in 5 to 6 weeks.  Please send me a message in 07/2019 to order your new thyroid ultrasound.  Please come back for another appointment in 6 months.

## 2020-04-06 NOTE — Progress Notes (Signed)
Patient ID: Brenda Schneider, female   DOB: 12/26/1970, 49 y.o.   MRN: [id number]   This visit occurred during the SARS-CoV-2 public health emergency.  Safety protocols were in place, including screening questions prior to the visit, additional usage of staff PPE, and extensive cleaning of exam room while observing appropriate contact time as indicated for disinfecting solutions.    HPI  Brenda Schneider is a 49 y.o.-year-old female, initially self-referred, returning for follow-up for Hashimoto's hypothyroidism and a thyroid nodule.  Last visit 8 months ago.  Since last visit, she had an episode of diverticulitis in 01/2020.  This resolved.  Hashimoto's hypothyroidism: - dx'ed in 2009  At last visit, she had symptoms of anxiety, weight gain, lack of sleep, fatigue, irritability, hot flashes alternating with cold intolerance.  She also described more stress at work.  However, her TFTs were normal then.  Pt is on levothyroxine 150 mcg daily (dose decreased 09/2019), taken: - in am - fasting - at least 30 min from b'fast - no Ca, Fe, MVI, PPIs - not on Biotin  We also started selenium 200 mcg daily in 07/2019.  I reviewed pt's thyroid tests: Lab Results  Component Value Date   TSH 7.85 (H) 04/04/2020   TSH 3.170 12/02/2019   TSH 4.49 11/08/2019   TSH 0.13 (L) 09/29/2019   TSH 1.500 07/20/2019   TSH 2.32 08/23/2014   TSH 4.17 06/02/2014   TSH 8.30 (A) 04/04/2014   TSH 10.00 (A) 01/07/2013   FREET4 0.97 04/04/2020   FREET4 1.38 12/02/2019   FREET4 0.87 11/08/2019   FREET4 1.36 09/29/2019   FREET4 1.54 07/20/2019   FREET4 1.07 08/23/2014   FREET4 0.95 06/02/2014    Her antithyroid antibodies are elevated and improved on selenium: Component     Latest Ref Rng & Units 07/20/2019 09/29/2019 04/04/2020  Thyroglobulin Antibody     0.0 - 0.9 IU/mL 4.4 (H)    Thyroperoxidase Ab SerPl-aCnc     <9 IU/mL 459 (H) 159 (H) 169 (H)  Thyroglobulin Ab     < or = 1 IU/mL  3 (H) 3 (H)    04/05/2015: TPO antibodies 228 (0-34), ATA antibodies 3.8 (0-0.9)  Thyroid nodule: - dx'ed in 2015 -Biopsy was attempted in 2015 but it was not done due to the pseudonodular appearance -At last visit, she was feeling slight pressure in neck, having to clear her throat more frequently and having hoarseness.  We checked a new thyroid ultrasound and the nodule appeared to have increased, and the recommendation was to follow it up with another ultrasound in a year  At today's visit, pt denies: - feeling nodules in neck - hoarseness - dysphagia - choking - SOB with lying down   Thyroid U/S (02/13/2014):    Attempted nodule biopsy (04/06/2015): Diffusely heterogeneous and lobulated thyroid gland. The appearance is most consistent with chronic thyroid hormone replacement therapy versus chronic thyroiditis.  There is a rounded hyperechoic focus measuring 10 x 7 x 8 mm in the mid aspect of the gland as well as scattered small 2-3 mm hyperechoic foci throughout the gland. I favor these to represent pseudo nodules. If the echogenic focus is in fact a true nodule it has benign imaging features and is below the size threshold for biopsy.   Thyroid U/S (07/25/2019): Parenchymal Echotexture: Markedly heterogenous Isthmus: 0.3 cm Right lobe: 4.8 x 2.5 x 2.4 cm Left lobe: 3.9 x 1.9 x 1.5 cm _________________________________________________________  Nodule # 1: Prior biopsy: No Location: Right; Mid  Maximum size: 1.7 cm; Other 2 dimensions: 1.0 x 1.3 cm, previously, 1.0 x 0.8 x 0.7 cm Composition: solid/almost completely solid (2) Echogenicity: hyperechoic (1) ACR TI-RADS total points: 3. ACR TI-RADS risk category:  TR3 (3 points). Significant change in size (>/= 20% in two dimensions and minimal increase of 2 mm): Yes Change in features: No Change in ACR TI-RADS risk category: No ACR TI-RADS recommendations: *Given size (>/= 1.5 - 2.4 cm) and appearance, a follow-up ultrasound in 1  year should be considered based on TI-RADS criteria. _________________________________________________________  IMPRESSION: Solitary TI-RADS category 3 nodule in the right mid gland demonstrates slow growth but no significant interval change in appearance or morphology over the past 5 years. The nodule currently meets criteria for annual surveillance. Recommend follow-up ultrasound in 1 year.  Diffusely heterogeneous, lobular and enlarged background thyroid gland. Differential considerations include chronic thyroiditis and diffuse goitrous change.       + FH of thyroid ds.: PGM. No FH of thyroid cancer. No h/o radiation tx to head or neck.  No recent contrast studies. No herbal supplements. No Biotin use. No recent steroids use.   She also has a history of HTN.  ROS: Constitutional: no weight gain/no weight loss, no fatigue, no subjective hyperthermia, no subjective hypothermia Eyes: no blurry vision, no xerophthalmia ENT: no sore throat, + see HPI Cardiovascular: no CP/no SOB/no palpitations/no leg swelling Respiratory: no cough/no SOB/no wheezing Gastrointestinal: no N/no V/no D/no C/no acid reflux Musculoskeletal: no muscle aches/no joint aches Skin: no rashes, + hair loss Neurological: no tremors/no numbness/no tingling/no dizziness, + headaches  I reviewed pt's medications, allergies, PMH, social hx, family hx, and changes were documented in the history of present illness. Otherwise, unchanged from my initial visit note.  Past Medical History:  Diagnosis Date  . 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 Hypertension   . Menorrhagia   . Uterine fibroid    Past Surgical History:  Procedure Laterality Date  . HYSTEROSCOPY WITH D & C  2007  . LAPAROSCOPIC CHOLECYSTECTOMY  Feb 1999  . LAPAROSCOPIC VAGINAL HYSTERECTOMY WITH SALPINGECTOMY Bilateral 11/01/2015   Procedure: LAPAROSCOPIC  ASSISTED VAGINAL HYSTERECTOMY WITH SALPINGECTOMY;  Surgeon: Dian Queen, MD;  Location: [facility];  Service: Gynecology;  Laterality: Bilateral;  VAGINAL  . TONSILLECTOMY  1991  . TUBAL LIGATION  Mar 1999  . TYMPANOSTOMY TUBE PLACEMENT Bilateral 1980's   Social History   Socioeconomic History  . Marital status: Married    Spouse name: Not on file  . Number of children: 3: 26, 25, 22 in 07/2019  . Years of education: Not on file  . Highest education level: Not on file  Occupational History  . Nurse  Tobacco Use  . Smoking status: Never Smoker  . Smokeless tobacco: Never Used  Substance and Sexual Activity  . Alcohol use:  Socially  . Drug use: No  . Sexual activity: Not on file  Other Topics Concern  . Not on file  Social History Narrative  . Not on file   Social Determinants of Health   Financial Resource Strain:   . Difficulty of Paying Living Expenses: Not on file  Food Insecurity:   . Worried About Charity fundraiser in the Last Year: Not on file  . Ran Out of Food in the Last Year: Not on file  Transportation Needs:   . Lack of Transportation (Medical): Not on file  .  Lack of Transportation (Non-Medical): Not on file  Physical Activity:   . Days of Exercise per Week: Not on file  . Minutes of Exercise per Session: Not on file  Stress:   . Feeling of Stress : Not on file  Social Connections:   . Frequency of Communication with Friends and Family: Not on file  . Frequency of Social Gatherings with Friends and Family: Not on file  . Attends Religious Services: Not on file  . Active Member of Clubs or Organizations: Not on file  . Attends Archivist Meetings: Not on file  . Marital Status: Not on file  Intimate Partner Violence:   . Fear of Current or Ex-Partner: Not on file  . Emotionally Abused: Not on file  . Physically Abused: Not on file  . Sexually Abused: Not on file   Current Outpatient Medications on File Prior to Visit   Medication Sig Dispense Refill  . acetaminophen (TYLENOL) 500 MG tablet Take 2 tablets (1,000 mg total) by mouth every 8 (eight) hours as needed for mild pain, moderate pain, fever or headache. 50 tablet 0  . acyclovir (ZOVIRAX) 400 MG tablet Take 1 tablet (400 mg total) by mouth 3 (three) times daily. 30 tablet 2  . amLODipine (NORVASC) 10 MG tablet Take 1 tablet (10 mg total) by mouth daily. 90 tablet 1  . hydrochlorothiazide (HYDRODIURIL) 25 MG tablet Take 1 tablet (25 mg total) by mouth daily. 90 tablet 1  . levothyroxine (SYNTHROID) 150 MCG tablet Take 1 tablet (150 mcg total) by mouth daily before breakfast. 45 tablet 3  . oxymetazoline (AFRIN) 0.05 % nasal spray Place 1 spray into both nostrils as needed for congestion.    . phentermine 37.5 MG capsule Take 1 capsule (37.5 mg total) by mouth every morning. 30 capsule 0  . phentermine 37.5 MG capsule Take 1 capsule (37.5 mg total) by mouth every morning. 30 capsule 0   No current facility-administered medications on file prior to visit.   No Known Allergies Family History  Problem Relation Age of Onset  . Cancer Mother   . Thyroid disease Paternal Grandmother   . Heart disease Paternal Grandmother   . Diabetes Neg Hx     PE: BP (!) 154/92   Pulse 96   Ht 5\' 3"  (1.6 m)   Wt 160 lb (72.6 kg)   LMP 10/08/2015 (Exact Date)   SpO2 98%   BMI 28.34 kg/m  Wt Readings from Last 3 Encounters:  04/06/20 160 lb (72.6 kg)  03/30/20 164 lb (74.4 kg)  02/23/20 162 lb (73.5 kg)   Constitutional: Slightly overweight, in NAD Eyes: PERRLA, EOMI, no exophthalmos ENT: moist mucous membranes, no thyromegaly, no cervical lymphadenopathy Cardiovascular: No tachycardia at the end of the exam, RR, No MRG Respiratory: CTA B Gastrointestinal: abdomen soft, NT, ND, BS+ Musculoskeletal: no deformities, strength intact in all 4 Skin: moist, warm, no rashes Neurological: no tremor with outstretched hands, DTR normal in all 4  ASSESSMENT: 1.  Thyroid nodule  2.  Treatment of thyroiditis  PLAN: 1. Thyroid nodule -I reviewed the images of her thyroid nodule.  This is not very large and also is: - not hypoechoic, intact it is hyperechoic and similar to other multiple scattered small of thyroid nodules throughout the entire lab - without microcalcifications - without internal blood flow - more wide than tall -The nodule was first seen in 2015 and it does not appear to have grown, however, this is most likely a  consequence of increasing autoimmunity (higher thyroid antibody titer) with subsequent increase inflammation.  It is possible that this nodule is a pseudonodule.  These nodules are not worrisome and may disappear with time when inflammation decreases. -She does not have a family history of thyroid cancer personal history of radiation therapy to head or neck to increase her risk of cancer -No neck compression symptoms.  We discussed that with Hashimoto's thyroiditis fullness any compression in her neck is possible whenever antibodies are higher  -At last visit we started selenium which can also help decreasing the inflammation in the gland and possibly shrink to the nodules -She is due for a repeat ultrasound in 07/2019.  I advised her to contact me at that time so I can order the ultrasound.  2.  Hashimoto's hypothyroidism -Patient with a history of hypothyroidism due to Hashimoto's thyroiditis, with fluctuating TFTs. -We started selenium 200 mcg daily at last visit, to help improve her antithyroid antibodies and these improved at the follow-up check from 09/2019 -She had labs 2 days ago and it appears that her TPO antibodies increase slightly, while ATA antibodies are stable: Component     Latest Ref Rng & Units 07/20/2019 09/29/2019 04/04/2020  Thyroglobulin Antibody     0.0 - 0.9 IU/mL 4.4 (H)    Thyroperoxidase Ab SerPl-aCnc     <9 IU/mL 459 (H) 159 (H) 169 (H)  Thyroglobulin Ab     < or = 1 IU/mL  3 (H) 3 (H)  -For now,  we discussed about continuing selenium - latest thyroid labs reviewed with pt >> elevated at last check Lab Results  Component Value Date   TSH 7.85 (H) 04/04/2020   - she continues on LT4 150 mcg daily - pt feels good on this dose. - we discussed about taking the thyroid hormone every day, with water, >30 minutes before breakfast, separated by >4 hours from acid reflux medications, calcium, iron, multivitamins. Pt. is taking it correctly. -At this visit, we discussed about increasing the levothyroxine to 175 alternating with 150 mcg every other day.  She agrees with this plan -We will recheck her TFTs in 1.5 months after the above dose change -I will then see her back in 6 months.  Philemon Kingdom, MD PhD [facility] Endocrinology

## 2020-04-19 MED FILL — LEVOTHYROXINE SODIUM 150 MC: 150 | 45 days supply | Qty: 45 | Fill #0

## 2020-05-14 ENCOUNTER — Encounter: Payer: 59 | Admitting: Adult Health

## 2020-05-15 ENCOUNTER — Ambulatory Visit: Payer: 59 | Admitting: Hospice and Palliative Medicine

## 2020-05-15 ENCOUNTER — Other Ambulatory Visit: Payer: Self-pay

## 2020-05-15 ENCOUNTER — Other Ambulatory Visit: Payer: Self-pay | Admitting: Hospice and Palliative Medicine

## 2020-05-15 ENCOUNTER — Encounter: Payer: Self-pay | Admitting: Hospice and Palliative Medicine

## 2020-05-15 VITALS — BP 130/80 | HR 77 | Temp 98.1°F | Resp 16 | Ht 63.0 in | Wt 164.0 lb

## 2020-05-15 DIAGNOSIS — Z0000 Encounter for general adult medical examination without abnormal findings: Secondary | ICD-10-CM | POA: Diagnosis not present

## 2020-05-15 DIAGNOSIS — R300 Dysuria: Secondary | ICD-10-CM | POA: Diagnosis not present

## 2020-05-15 DIAGNOSIS — E039 Hypothyroidism, unspecified: Secondary | ICD-10-CM

## 2020-05-15 DIAGNOSIS — Z6829 Body mass index (BMI) 29.0-29.9, adult: Secondary | ICD-10-CM

## 2020-05-15 DIAGNOSIS — I10 Essential (primary) hypertension: Secondary | ICD-10-CM

## 2020-05-15 MED ORDER — PHENTERMINE HCL 37.5 MG PO CAPS: 37.5000 mg | ORAL_CAPSULE | ORAL | 0 refills | Status: DC

## 2020-05-15 MED FILL — PHENTERMINE 37.5 MG CAPSULE: 37.5 | 30 days supply | Qty: 30 | Fill #0

## 2020-05-15 NOTE — Progress Notes (Signed)
[facility] [place], [place] [address]  Internal MEDICINE  Office Visit Note  Patient Name: Brenda Schneider  [id number]  [id number]  Date of Service: 05/15/2020  Chief Complaint  Patient presents with  . Annual Exam  . Hypertension  . Hypothyroidism  . Medical Management of Chronic Issues    Weight management  . Other    controlled substance policy reviewed      HPI Pt is here for routine health maintenance examination Followed by endocrinology for hypothyroidism--due for repeat labs this Friday Has annual exams at GYN for vaginal and breast exams Had recent flare of diverticulitis--has fully recovered, now back to eating a regular diet She has been off of phentermine since being diagnosed with diverticulitis She would like to restart phentermine but would only like to be on this for a short term to boost her weight loss Her goal weight is 130-135 She has been working on healthy eating as well as exercise to help with weight loss Says her BP as been well controlled since the addition of amlodipine to her HCTZ   Current Medication: Outpatient Encounter Medications as of 05/15/2020  Medication Sig  . acyclovir (ZOVIRAX) 400 MG tablet Take 1 tablet (400 mg total) by mouth 3 (three) times daily.  Marland Kitchen amLODipine (NORVASC) 10 MG tablet Take 1 tablet (10 mg total) by mouth daily.  . hydrochlorothiazide (HYDRODIURIL) 25 MG tablet Take 1 tablet (25 mg total) by mouth daily.  Marland Kitchen levothyroxine (SYNTHROID) 150 MCG tablet Take 1 tablet (150 mcg total) by mouth daily before breakfast.  . levothyroxine (SYNTHROID) 175 MCG tablet Take 175 mcg by mouth. Every other day alternate with 163mcg  . oxymetazoline (AFRIN) 0.05 % nasal spray Place 1 spray into both nostrils as needed for congestion.  . phentermine 37.5 MG capsule Take 1 capsule (37.5 mg total) by mouth every morning.  . selenium 50 MCG TABS tablet Take 50 mcg by mouth daily.  . [DISCONTINUED] levothyroxine  (SYNTHROID) 150 MCG tablet Take 1 tablet (150 mcg total) by mouth every other day.  . [DISCONTINUED] phentermine 37.5 MG capsule Take 1 capsule (37.5 mg total) by mouth every morning.  . [DISCONTINUED] phentermine 37.5 MG capsule Take 1 capsule (37.5 mg total) by mouth every morning.   No facility-administered encounter medications on file as of 05/15/2020.    Surgical History: Past Surgical History:  Procedure Laterality Date  . HYSTEROSCOPY WITH D & C  2007  . LAPAROSCOPIC CHOLECYSTECTOMY  Feb 1999  . LAPAROSCOPIC VAGINAL HYSTERECTOMY WITH SALPINGECTOMY Bilateral 11/01/2015   Procedure: LAPAROSCOPIC ASSISTED VAGINAL HYSTERECTOMY WITH SALPINGECTOMY;  Surgeon: Dian Queen, MD;  Location: [place];  Service: Gynecology;  Laterality: Bilateral;  VAGINAL  . TONSILLECTOMY  1991  . TUBAL LIGATION  Mar 1999  . TYMPANOSTOMY TUBE PLACEMENT Bilateral 1980's    Medical History: Past Medical History:  Diagnosis Date  . 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 Hypertension   . Menorrhagia   . Uterine fibroid     Family History: Family History  Problem Relation Age of Onset  . Cancer Mother   . Thyroid disease Paternal Grandmother   . Heart disease Paternal Grandmother   . Diabetes Neg Hx       Review of Systems  Constitutional: Negative for chills, diaphoresis and fatigue.  HENT: Negative for ear pain, postnasal drip and sinus pressure.   Eyes: Negative for photophobia, discharge, redness,  itching and visual disturbance.  Respiratory: Negative for cough, shortness of breath and wheezing.   Cardiovascular: Negative for chest pain, palpitations and leg swelling.  Gastrointestinal: Negative for abdominal pain, constipation, diarrhea, nausea and vomiting.  Genitourinary: Negative for dysuria and flank pain.  Musculoskeletal: Negative for arthralgias, back pain, gait problem and neck  pain.  Skin: Negative for color change.  Allergic/Immunologic: Negative for environmental allergies and food allergies.  Neurological: Negative for dizziness and headaches.  Hematological: Does not bruise/bleed easily.  Psychiatric/Behavioral: Negative for agitation, behavioral problems (depression) and hallucinations.     Vital Signs: BP 130/80   Pulse 77   Temp 98.1 F (36.7 C)   Resp 16   Ht 5\' 3"  (1.6 m)   Wt 164 lb (74.4 kg)   LMP 10/08/2015 (Exact Date)   SpO2 99%   BMI 29.05 kg/m    Physical Exam Vitals reviewed.  Constitutional:      Appearance: Normal appearance.  Cardiovascular:     Rate and Rhythm: Normal rate and regular rhythm.     Pulses: Normal pulses.     Heart sounds: Normal heart sounds.  Pulmonary:     Effort: Pulmonary effort is normal.     Breath sounds: Normal breath sounds.  Abdominal:     General: Abdomen is flat.  Musculoskeletal:        General: Normal range of motion.  Skin:    General: Skin is warm.  Neurological:     General: No focal deficit present.     Mental Status: She is alert and oriented to person, place, and time. Mental status is at baseline.  Psychiatric:        Mood and Affect: Mood normal.        Behavior: Behavior normal.    LABS: Recent Results (from the past 2160 hour(s))  Thyroid peroxidase antibody     Status: Abnormal   Collection Time: 04/04/20  8:34 AM  Result Value Ref Range   Thyroperoxidase Ab SerPl-aCnc 169 (H) <9 IU/mL  Thyroglobulin antibody     Status: Abnormal   Collection Time: 04/04/20  8:34 AM  Result Value Ref Range   Thyroglobulin Ab 3 (H) < or = 1 IU/mL  T4, free     Status: None   Collection Time: 04/04/20  8:34 AM  Result Value Ref Range   Free T4 0.97 0.60 - 1.60 ng/dL    Comment: Specimens from patients who are undergoing biotin therapy and /or ingesting biotin supplements may contain high levels of biotin.  The higher biotin concentration in these specimens interferes with this Free T4  assay.  Specimens that contain high levels  of biotin may cause false high results for this Free T4 assay.  Please interpret results in light of the total clinical presentation of the patient.    TSH     Status: Abnormal   Collection Time: 04/04/20  8:34 AM  Result Value Ref Range   TSH 7.85 (H) 0.35 - 4.50 uIU/mL   Reviewed PDMP, Narcotic score 80, Sedative score 90, Situmulant score 160, Overdose risk 290  Assessment/Plan: 1. Encounter for routine adult health examination without abnormal findings Well appearing 49 year old female Lipid panel ordered for annual review, all other previous labs reviewed Mammogram ordered for annual screening - Lipid Panel With LDL/HDL Ratio - MM Digital Screening; Future  2. BMI 29.0-29.9,adult Refilled phentermine for 30 day supply--discussed she will need to be seen in office for further refills, 30 day supplies will be  given at a time Will monitor weight at each visit--will discontinue therapy if weight plateau Advised therapy will only be given for 9 months followed by time off of mediation Encouraged to continue with calorie counting based on previous metabolic testing results - phentermine 37.5 MG capsule; Take 1 capsule (37.5 mg total) by mouth every morning.  Dispense: 30 capsule; Refill: 0  3. Hypothyroidism, unspecified type Followed and managed by endocrinology--scheduled for labs on Friday  4. Essential hypertension BP and HR well controlled today on current therapy, will continue with routine monitoring  5. Dysuria - UA/M w/rflx Culture, Routine  General Counseling: Alvita verbalizes understanding of the findings of todays visit and agrees with plan of treatment. I have discussed any further diagnostic evaluation that may be needed or ordered today. We also reviewed her medications today. she has been encouraged to call the office with any questions or concerns that should arise related to todays visit.    Counseling:    Orders  Placed This Encounter  Procedures  . MM Digital Screening  . UA/M w/rflx Culture, Routine  . Lipid Panel With LDL/HDL Ratio    Meds ordered this encounter  Medications  . phentermine 37.5 MG capsule    Sig: Take 1 capsule (37.5 mg total) by mouth every morning.    Dispense:  30 capsule    Refill:  0    Total time spent: 30 Minutes  Time spent includes review of chart, medications, test results, and follow up plan with the patient.   This patient was seen by Casey Burkitt AGNP-C Collaboration with Dr Lavera Guise as a part of collaborative care agreement   Tanna Furry. [facility] Internal Medicine

## 2020-05-16 LAB — UA/M W/RFLX CULTURE, ROUTINE
Bilirubin, UA: NEGATIVE
Glucose, UA: NEGATIVE
Ketones, UA: NEGATIVE
Leukocytes,UA: NEGATIVE
Nitrite, UA: NEGATIVE
Protein,UA: NEGATIVE
RBC, UA: NEGATIVE
Specific Gravity, UA: 1.024 (ref 1.005–1.030)
Urobilinogen, Ur: 0.2 mg/dL (ref 0.2–1.0)
pH, UA: 5.5 (ref 5.0–7.5)

## 2020-05-16 LAB — MICROSCOPIC EXAMINATION
Bacteria, UA: NONE SEEN
Casts: NONE SEEN /lpf
RBC, Urine: NONE SEEN /hpf (ref 0–2)
WBC, UA: NONE SEEN /hpf (ref 0–5)

## 2020-06-20 ENCOUNTER — Encounter: Payer: Self-pay | Admitting: Hospice and Palliative Medicine

## 2020-06-20 ENCOUNTER — Other Ambulatory Visit: Payer: Self-pay

## 2020-06-20 ENCOUNTER — Ambulatory Visit: Payer: 59 | Admitting: Hospice and Palliative Medicine

## 2020-06-20 ENCOUNTER — Other Ambulatory Visit: Payer: Self-pay | Admitting: Hospice and Palliative Medicine

## 2020-06-20 DIAGNOSIS — E039 Hypothyroidism, unspecified: Secondary | ICD-10-CM

## 2020-06-20 DIAGNOSIS — I10 Essential (primary) hypertension: Secondary | ICD-10-CM

## 2020-06-20 DIAGNOSIS — Z6829 Body mass index (BMI) 29.0-29.9, adult: Secondary | ICD-10-CM

## 2020-06-20 MED ORDER — HYDROCHLOROTHIAZIDE 25 MG PO TABS: 25.0000 mg | ORAL_TABLET | Freq: Every day | ORAL | 1 refills | Status: DC

## 2020-06-20 MED ORDER — PHENTERMINE HCL 37.5 MG PO CAPS: 37.5000 mg | ORAL_CAPSULE | ORAL | 0 refills | Status: DC

## 2020-06-20 MED ORDER — AMLODIPINE BESYLATE 10 MG PO TABS: 10.0000 mg | ORAL_TABLET | Freq: Every day | ORAL | 1 refills | Status: DC

## 2020-06-20 MED FILL — HYDROCHLOROTHIAZIDE 25 MG T: 25 | 90 days supply | Qty: 90 | Fill #0

## 2020-06-20 MED FILL — PHENTERMINE 37.5 MG CAPSULE: 37.5 | 30 days supply | Qty: 30 | Fill #0

## 2020-06-20 MED FILL — AMLODIPINE BESYLATE 10 MG T: 10 | 90 days supply | Qty: 90 | Fill #0

## 2020-06-20 NOTE — Progress Notes (Signed)
[facility] [place], [place] [address]  Internal MEDICINE  Office Visit Note  Patient Name: Brenda Schneider  [id number]  [id number]  Date of Service: 06/24/2020  Chief Complaint  Patient presents with  . Medical Management of Chronic Issues    Weight managment  . Hypertension    HPI Patient is here for routine follow-up She is followed for weight management, currently taking 37.5 mg of phentermine, this is month 2-goal weight is 135 pounds She is counting her calories and carbs--goal calorie count is 1200, she is also exercising, she is doing a modified keto diet Denies any negative side effects with taking phentermine such as palpitations, headaches, visual disturbances, chest pain  Reports that she sleeps well at night, feels rested throughout the day, has never been told that she snores  Known history of hypothyroidism--managed by endocrinology, currently alternating 125-150 mcg   Current Medication: Outpatient Encounter Medications as of 06/20/2020  Medication Sig  . acyclovir (ZOVIRAX) 400 MG tablet Take 1 tablet (400 mg total) by mouth 3 (three) times daily.  Marland Kitchen amLODipine (NORVASC) 10 MG tablet Take 1 tablet (10 mg total) by mouth daily.  . hydrochlorothiazide (HYDRODIURIL) 25 MG tablet Take 1 tablet (25 mg total) by mouth daily.  Marland Kitchen levothyroxine (SYNTHROID) 150 MCG tablet Take 1 tablet (150 mcg total) by mouth daily before breakfast.  . levothyroxine (SYNTHROID) 175 MCG tablet Take 175 mcg by mouth. Every other day alternate with 142mcg  . oxymetazoline (AFRIN) 0.05 % nasal spray Place 1 spray into both nostrils as needed for congestion.  . phentermine 37.5 MG capsule Take 1 capsule (37.5 mg total) by mouth every morning.  . selenium 50 MCG TABS tablet Take 50 mcg by mouth daily.  . [DISCONTINUED] amLODipine (NORVASC) 10 MG tablet Take 1 tablet (10 mg total) by mouth daily.  . [DISCONTINUED] hydrochlorothiazide (HYDRODIURIL) 25 MG tablet Take 1  tablet (25 mg total) by mouth daily.  . [DISCONTINUED] phentermine 37.5 MG capsule Take 1 capsule (37.5 mg total) by mouth every morning.   No facility-administered encounter medications on file as of 06/20/2020.    Surgical History: Past Surgical History:  Procedure Laterality Date  . HYSTEROSCOPY WITH D & C  2007  . LAPAROSCOPIC CHOLECYSTECTOMY  Feb 1999  . LAPAROSCOPIC VAGINAL HYSTERECTOMY WITH SALPINGECTOMY Bilateral 11/01/2015   Procedure: LAPAROSCOPIC ASSISTED VAGINAL HYSTERECTOMY WITH SALPINGECTOMY;  Surgeon: Dian Queen, MD;  Location: [facility];  Service: Gynecology;  Laterality: Bilateral;  VAGINAL  . TONSILLECTOMY  1991  . TUBAL LIGATION  Mar 1999  . TYMPANOSTOMY TUBE PLACEMENT Bilateral 1980's    Medical History: Past Medical History:  Diagnosis Date  . 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 Hypertension   . Menorrhagia   . Uterine fibroid     Family History: Family History  Problem Relation Age of Onset  . Cancer Mother   . Thyroid disease Paternal Grandmother   . Heart disease Paternal Grandmother   . Diabetes Neg Hx     Social History   Socioeconomic History  . Marital status: Married    Spouse name: Not on file  . Number of children: Not on file  . Years of education: Not on file  . Highest education level: Not on file  Occupational History  . Not on file  Tobacco Use  . Smoking status: Never Smoker  . Smokeless tobacco: Never Used  Substance and Sexual  Activity  . Alcohol use: Never  . Drug use: No  . Sexual activity: Not on file  Other Topics Concern  . Not on file  Social History Narrative  . Not on file   Social Determinants of Health   Financial Resource Strain:   . Difficulty of Paying Living Expenses: Not on file  Food Insecurity:   . Worried About Charity fundraiser in the Last Year: Not on file  . Ran Out of Food in the Last  Year: Not on file  Transportation Needs:   . Lack of Transportation (Medical): Not on file  . Lack of Transportation (Non-Medical): Not on file  Physical Activity:   . Days of Exercise per Week: Not on file  . Minutes of Exercise per Session: Not on file  Stress:   . Feeling of Stress : Not on file  Social Connections:   . Frequency of Communication with Friends and Family: Not on file  . Frequency of Social Gatherings with Friends and Family: Not on file  . Attends Religious Services: Not on file  . Active Member of Clubs or Organizations: Not on file  . Attends Archivist Meetings: Not on file  . Marital Status: Not on file  Intimate Partner Violence:   . Fear of Current or Ex-Partner: Not on file  . Emotionally Abused: Not on file  . Physically Abused: Not on file  . Sexually Abused: Not on file   Review of Systems  Constitutional: Negative for chills, diaphoresis and fatigue.  HENT: Negative for ear pain, postnasal drip and sinus pressure.   Eyes: Negative for photophobia, discharge, redness, itching and visual disturbance.  Respiratory: Negative for cough, shortness of breath and wheezing.   Cardiovascular: Negative for chest pain, palpitations and leg swelling.  Gastrointestinal: Negative for abdominal pain, constipation, diarrhea, nausea and vomiting.  Genitourinary: Negative for dysuria and flank pain.  Musculoskeletal: Negative for arthralgias, back pain, gait problem and neck pain.  Skin: Negative for color change.  Allergic/Immunologic: Negative for environmental allergies and food allergies.  Neurological: Negative for dizziness and headaches.  Hematological: Does not bruise/bleed easily.  Psychiatric/Behavioral: Negative for agitation, behavioral problems (depression) and hallucinations.    Vital Signs: BP 134/82   Pulse 78   Temp 98.6 F (37 C)   Resp 16   Ht 5\' 3"  (1.6 m)   Wt 164 lb (74.4 kg)   LMP 10/08/2015 (Exact Date)   SpO2 98%   BMI  29.05 kg/m    Physical Exam Vitals reviewed.  Constitutional:      Appearance: Normal appearance. She is obese.  Cardiovascular:     Rate and Rhythm: Normal rate and regular rhythm.     Pulses: Normal pulses.     Heart sounds: Normal heart sounds.  Pulmonary:     Effort: Pulmonary effort is normal.     Breath sounds: Normal breath sounds.  Abdominal:     General: Abdomen is flat.     Palpations: Abdomen is soft.  Musculoskeletal:        General: Normal range of motion.     Cervical back: Normal range of motion.  Skin:    General: Skin is warm.  Neurological:     General: No focal deficit present.     Mental Status: She is alert and oriented to person, place, and time. Mental status is at baseline.  Psychiatric:        Mood and Affect: Mood normal.  Behavior: Behavior normal.        Thought Content: Thought content normal.        Judgment: Judgment normal.    Assessment/Plan: 1. Essential hypertension BP and HR well controlled today--requesting refills, continue with routine monitoring - amLODipine (NORVASC) 10 MG tablet; Take 1 tablet (10 mg total) by mouth daily.  Dispense: 90 tablet; Refill: 1 - hydrochlorothiazide (HYDRODIURIL) 25 MG tablet; Take 1 tablet (25 mg total) by mouth daily.  Dispense: 90 tablet; Refill: 1  2. BMI 29.0-29.9,adult Continue with phentermine, month 2 out of 9 Will need to have achieved weight loss at next follow-up visit to continue with therapy - phentermine 37.5 MG capsule; Take 1 capsule (37.5 mg total) by mouth every morning.  Dispense: 30 capsule; Refill: 0  3. Hypothyroidism, unspecified type Followed and managed by endocrinology, scheduled to have labs to check thyroid levels later this month  General Counseling: Flor verbalizes understanding of the findings of todays visit and agrees with plan of treatment. I have discussed any further diagnostic evaluation that may be needed or ordered today. We also reviewed her medications  today. she has been encouraged to call the office with any questions or concerns that should arise related to todays visit.    Meds ordered this encounter  Medications  . amLODipine (NORVASC) 10 MG tablet    Sig: Take 1 tablet (10 mg total) by mouth daily.    Dispense:  90 tablet    Refill:  1  . hydrochlorothiazide (HYDRODIURIL) 25 MG tablet    Sig: Take 1 tablet (25 mg total) by mouth daily.    Dispense:  90 tablet    Refill:  1  . phentermine 37.5 MG capsule    Sig: Take 1 capsule (37.5 mg total) by mouth every morning.    Dispense:  30 capsule    Refill:  0    Time spent: 30 Minutes Time spent includes review of chart, medications, test results and follow-up plan with the patient.  This patient was seen by Theodoro Grist AGNP-C in Collaboration with Dr Lavera Guise as a part of collaborative care agreement     Tanna Furry. Dowell Hoon AGNP-C Internal medicine

## 2020-06-24 ENCOUNTER — Encounter: Payer: Self-pay | Admitting: Hospice and Palliative Medicine

## 2020-07-12 ENCOUNTER — Encounter: Payer: Self-pay | Admitting: Hospice and Palliative Medicine

## 2020-07-12 ENCOUNTER — Encounter: Payer: Self-pay | Admitting: Nurse Practitioner

## 2020-07-12 ENCOUNTER — Ambulatory Visit: Payer: 59 | Admitting: Hospice and Palliative Medicine

## 2020-07-12 ENCOUNTER — Other Ambulatory Visit: Payer: Self-pay | Admitting: Hospice and Palliative Medicine

## 2020-07-12 VITALS — Temp 99.6°F | Resp 16 | Ht 63.0 in | Wt 164.0 lb

## 2020-07-12 DIAGNOSIS — J0140 Acute pansinusitis, unspecified: Secondary | ICD-10-CM

## 2020-07-12 DIAGNOSIS — R059 Cough, unspecified: Secondary | ICD-10-CM | POA: Diagnosis not present

## 2020-07-12 MED ORDER — HYDROCOD POLST-CPM POLST ER 10-8 MG/5ML PO SUER: 5.0000 mL | Freq: Two times a day (BID) | ORAL | 0 refills | Status: DC | PRN

## 2020-07-12 MED ORDER — AMOXICILLIN-POT CLAVULANATE 875-125 MG PO TABS: 1.0000 | ORAL_TABLET | Freq: Two times a day (BID) | ORAL | 0 refills | Status: DC

## 2020-07-12 NOTE — Progress Notes (Signed)
[facility] [place], [place] [address]  Internal MEDICINE  Telephone Visit  Patient Name: Brenda Schneider  [id number]  [id number]  Date of Service: 07/20/2020  I connected with the patient at 1220  by telephone and verified the patients identity using two identifiers.   I discussed the limitations, risks, security and privacy concerns of performing an evaluation and management service by telephone and the availability of in person appointments. I also discussed with the patient that there may be a patient responsible charge related to the service.  The patient expressed understanding and agrees to proceed.    Chief Complaint  Patient presents with  . telephone assement    [phone]  . Telephone Screen    Covid negative  . Cough  . Sinusitis  . Sore Throat  . Telephone Assessment  . 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 acyclovir (ZOVIRAX) 400 MG tablet Take 1 tablet (400 mg total) by mouth 3 (three) times daily.  Marland Kitchen amLODipine (NORVASC) 10 MG tablet Take 1 tablet (10 mg total) by mouth daily.  . hydrochlorothiazide (HYDRODIURIL) 25 MG tablet Take 1 tablet (25 mg total) by mouth daily.  Marland Kitchen levothyroxine (SYNTHROID) 150 MCG tablet Take 1 tablet (150 mcg total) by mouth daily before breakfast.  . levothyroxine (SYNTHROID) 175 MCG tablet Take  175 mcg by mouth. Every other day alternate with 178mcg  . oxymetazoline (AFRIN) 0.05 % nasal spray Place 1 spray into both nostrils as needed for congestion.  . phentermine 37.5 MG capsule Take 1 capsule (37.5 mg total) by mouth every morning.  . selenium 50 MCG TABS tablet Take 50 mcg by mouth daily.  Marland Kitchen amoxicillin-clavulanate (AUGMENTIN) 875-125 MG tablet Take 1 tablet by mouth 2 (two) times daily.  . chlorpheniramine-HYDROcodone (TUSSIONEX PENNKINETIC ER) 10-8 MG/5ML SUER Take 5 mLs by mouth every 12 (twelve) hours as needed for cough.   No facility-administered encounter medications on file as of 07/12/2020.    Surgical History: Past Surgical History:  Procedure Laterality Date  . HYSTEROSCOPY WITH D & C  2007  . LAPAROSCOPIC CHOLECYSTECTOMY  Feb 1999  . LAPAROSCOPIC VAGINAL HYSTERECTOMY WITH SALPINGECTOMY Bilateral 11/01/2015   Procedure: LAPAROSCOPIC ASSISTED VAGINAL HYSTERECTOMY WITH SALPINGECTOMY;  Surgeon: Dian Queen, MD;  Location: [facility];  Service: Gynecology;  Laterality: Bilateral;  VAGINAL  . TONSILLECTOMY  1991  . TUBAL LIGATION  Mar 1999  . TYMPANOSTOMY TUBE PLACEMENT Bilateral 1980's    Medical History: Past Medical History:  Diagnosis Date  . 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 Hypertension   . Menorrhagia   . Uterine fibroid     Family History: Family History  Problem Relation Age of Onset  . Cancer Mother   . Thyroid disease Paternal Grandmother   . Heart disease Paternal Grandmother   . Diabetes Neg  Hx     Social History   Socioeconomic History  . Marital status: Married    Spouse name: Not on file  . Number of children: Not on file  . Years of education: Not on file  . Highest education level: Not on file  Occupational History  . Not on file  Tobacco Use  . Smoking status: Never Smoker  . Smokeless tobacco: Never Used  Substance and  Sexual Activity  . Alcohol use: Never  . Drug use: No  . Sexual activity: Not on file  Other Topics Concern  . Not on file  Social History Narrative  . Not on file   Social Determinants of Health   Financial Resource Strain: Not on file  Food Insecurity: Not on file  Transportation Needs: Not on file  Physical Activity: Not on file  Stress: Not on file  Social Connections: Not on file  Intimate Partner Violence: Not on file   Review of Systems  Constitutional: Positive for chills, fatigue and fever. Negative for diaphoresis.  HENT: Positive for congestion, sinus pressure, sinus pain and sore throat. Negative for ear pain and postnasal drip.   Eyes: Negative for photophobia, discharge, redness, itching and visual disturbance.  Respiratory: Positive for cough. Negative for shortness of breath and wheezing.   Cardiovascular: Negative for chest pain, palpitations and leg swelling.  Gastrointestinal: Negative for abdominal pain, constipation, diarrhea, nausea and vomiting.  Genitourinary: Negative for dysuria and flank pain.  Musculoskeletal: Negative for arthralgias, back pain, gait problem and neck pain.  Skin: Negative for color change.  Allergic/Immunologic: Negative for environmental allergies and food allergies.  Neurological: Negative for dizziness and headaches.  Hematological: Does not bruise/bleed easily.  Psychiatric/Behavioral: Negative for agitation, behavioral problems (depression) and hallucinations.    Vital Signs: Temp 99.6 F (37.6 C)   Resp 16   Ht 5\' 3"  (1.6 m)   Wt 164 lb (74.4 kg)   LMP 10/08/2015 (Exact Date)   BMI 29.05 kg/m    Observation/Objective: No observed acute distress or respiratory distress. Congestion heard in voice as well as coughing.  Assessment/Plan: 1. Acute non-recurrent pansinusitis Strongly advised to be retested for COVID as she may have been too close to start of symptoms Will start Augmentin therapy due to severity of  symptoms Encouraged to continue OTC medications for symptom management Advised to contact office early next week to update on symptoms - amoxicillin-clavulanate (AUGMENTIN) 875-125 MG tablet; Take 1 tablet by mouth 2 (two) times daily.  Dispense: 20 tablet; Refill: 0  2. Cough Tussionex to be used as needed preferably at night--discussed avoiding cough suppression increasing risk of pneumonia If cough worsens or persists will send for CXR - chlorpheniramine-HYDROcodone (TUSSIONEX PENNKINETIC ER) 10-8 MG/5ML SUER; Take 5 mLs by mouth every 12 (twelve) hours as needed for cough.  Dispense: 140 mL; Refill: 0  General Counseling: Shantee verbalizes understanding of the findings of today's phone visit and agrees with plan of treatment. I have discussed any further diagnostic evaluation that may be needed or ordered today. We also reviewed her medications today. she has been encouraged to call the office with any questions or concerns that should arise related to todays visit.   Meds ordered this encounter  Medications  . amoxicillin-clavulanate (AUGMENTIN) 875-125 MG tablet    Sig: Take 1 tablet by mouth 2 (two) times daily.    Dispense:  20 tablet    Refill:  0  . chlorpheniramine-HYDROcodone (TUSSIONEX PENNKINETIC ER) 10-8 MG/5ML SUER  Sig: Take 5 mLs by mouth every 12 (twelve) hours as needed for cough.    Dispense:  140 mL    Refill:  0     Time spent: 30 Minutes Time spent includes review of chart, medications, test results and follow-up plan with the patient.  Tanna Furry Akirah Storck AGNP-C Internal medicine

## 2020-07-14 ENCOUNTER — Encounter: Payer: Self-pay | Admitting: Hospice and Palliative Medicine

## 2020-07-15 ENCOUNTER — Other Ambulatory Visit: Payer: Self-pay

## 2020-07-15 ENCOUNTER — Ambulatory Visit
Admission: RE | Admit: 2020-07-15 | Discharge: 2020-07-15 | Disposition: A | Discharge status: Home / Self Care | Payer: 59 | Attending: Internal Medicine | Admitting: Internal Medicine

## 2020-07-15 ENCOUNTER — Ambulatory Visit
Admission: RE | Admit: 2020-07-15 | Discharge: 2020-07-15 | Disposition: A | Discharge status: Home / Self Care | Payer: 59 | Source: Ambulatory Visit | Attending: Internal Medicine | Admitting: Internal Medicine

## 2020-07-15 ENCOUNTER — Other Ambulatory Visit: Payer: Self-pay | Admitting: Internal Medicine

## 2020-07-15 DIAGNOSIS — J45991 Cough variant asthma: Secondary | ICD-10-CM | POA: Diagnosis not present

## 2020-07-15 DIAGNOSIS — R059 Cough, unspecified: Secondary | ICD-10-CM | POA: Diagnosis not present

## 2020-07-15 MED ORDER — ALBUTEROL SULFATE HFA 108 (90 BASE) MCG/ACT IN AERS: 2.0000 | INHALATION_SPRAY | Freq: Four times a day (QID) | RESPIRATORY_TRACT | 0 refills | Status: DC | PRN

## 2020-07-15 NOTE — Progress Notes (Signed)
a 

## 2020-07-16 ENCOUNTER — Ambulatory Visit (INDEPENDENT_AMBULATORY_CARE_PROVIDER_SITE_OTHER): Payer: 59

## 2020-07-16 ENCOUNTER — Other Ambulatory Visit: Payer: Self-pay | Admitting: Hospice and Palliative Medicine

## 2020-07-16 DIAGNOSIS — J329 Chronic sinusitis, unspecified: Secondary | ICD-10-CM | POA: Diagnosis not present

## 2020-07-16 LAB — POC INFLUENZA A&B (BINAX/QUICKVUE)
Influenza A, POC: NEGATIVE
Influenza B, POC: NEGATIVE

## 2020-07-16 MED ORDER — PREDNISONE 10 MG PO TABS: ORAL_TABLET | ORAL | 0 refills | Status: DC

## 2020-07-16 NOTE — Progress Notes (Signed)
Influenza negative, lung sounds clear bilaterally, congestion still noted in sinuses and chest, intense body aches and coughing. Continue with Augmentin and Tussionex at this time, will add low dose prednisone taper.

## 2020-07-18 ENCOUNTER — Ambulatory Visit: Payer: 59 | Admitting: Hospice and Palliative Medicine

## 2020-07-18 ENCOUNTER — Other Ambulatory Visit: Payer: 59

## 2020-07-18 DIAGNOSIS — Z20822 Contact with and (suspected) exposure to covid-19: Secondary | ICD-10-CM | POA: Diagnosis not present

## 2020-07-19 ENCOUNTER — Other Ambulatory Visit: Payer: Self-pay | Admitting: Hospice and Palliative Medicine

## 2020-07-19 ENCOUNTER — Encounter: Payer: Self-pay | Admitting: Hospice and Palliative Medicine

## 2020-07-19 MED ORDER — AZITHROMYCIN 250 MG PO TABS: ORAL_TABLET | ORAL | 0 refills | Status: DC

## 2020-07-20 ENCOUNTER — Encounter: Payer: Self-pay | Admitting: Hospice and Palliative Medicine

## 2020-07-20 LAB — SARS-COV-2, NAA 2 DAY TAT

## 2020-07-20 LAB — NOVEL CORONAVIRUS, NAA: SARS-CoV-2, NAA: NOT DETECTED

## 2020-07-23 ENCOUNTER — Telehealth: Payer: Self-pay

## 2020-07-23 NOTE — Telephone Encounter (Signed)
Received FMLA paperwork via fax on 07/19/2020 from [employer] patient last seen on 07/12/20 with TH and has an appointment scheduled on 08/17/20 with TH. Paperwork placed in folder by [facility] office.

## 2020-07-25 ENCOUNTER — Telehealth: Payer: Self-pay

## 2020-07-25 ENCOUNTER — Encounter: Payer: Self-pay | Admitting: Hospice and Palliative Medicine

## 2020-07-25 NOTE — Telephone Encounter (Signed)
-----   Message from Lyndon Code, MD sent at 07/25/2020  5:09 PM EST ----- Please call the pt and ask her how many total days she was out of work,  First date of not working Which day she went back

## 2020-07-25 NOTE — Telephone Encounter (Signed)
Can you check this? 

## 2020-07-25 NOTE — Telephone Encounter (Signed)
Dr Welton Flakes has patient FMLA paperwork and pt advised of status. Brenda Schneider

## 2020-07-25 NOTE — Telephone Encounter (Signed)
LMOM for pt to call office and give [place] information on her days out of work and when she went back per [facility]

## 2020-07-27 ENCOUNTER — Telehealth: Payer: Self-pay

## 2020-07-27 ENCOUNTER — Other Ambulatory Visit: Payer: Self-pay | Admitting: Hospice and Palliative Medicine

## 2020-07-27 DIAGNOSIS — J0140 Acute pansinusitis, unspecified: Secondary | ICD-10-CM

## 2020-07-27 NOTE — Telephone Encounter (Signed)
Pt husband will come to pick up completed [employer] paperwork on 07/27/2020.

## 2020-07-31 ENCOUNTER — Other Ambulatory Visit: Payer: Self-pay | Admitting: Hospice and Palliative Medicine

## 2020-07-31 ENCOUNTER — Encounter: Payer: Self-pay | Admitting: Hospice and Palliative Medicine

## 2020-07-31 DIAGNOSIS — B001 Herpesviral vesicular dermatitis: Secondary | ICD-10-CM

## 2020-07-31 MED ORDER — ACYCLOVIR 400 MG PO TABS: 400.0000 mg | ORAL_TABLET | Freq: Three times a day (TID) | ORAL | 2 refills | Status: DC

## 2020-08-17 ENCOUNTER — Ambulatory Visit: Payer: 59 | Admitting: Hospice and Palliative Medicine

## 2020-08-22 ENCOUNTER — Other Ambulatory Visit: Payer: Self-pay | Admitting: Internal Medicine

## 2020-08-22 ENCOUNTER — Encounter: Payer: Self-pay | Admitting: Internal Medicine

## 2020-08-22 ENCOUNTER — Other Ambulatory Visit (INDEPENDENT_AMBULATORY_CARE_PROVIDER_SITE_OTHER): Payer: 59

## 2020-08-22 DIAGNOSIS — E038 Other specified hypothyroidism: Secondary | ICD-10-CM | POA: Diagnosis not present

## 2020-08-22 DIAGNOSIS — E063 Autoimmune thyroiditis: Secondary | ICD-10-CM

## 2020-08-22 MED FILL — LEVOTHYROXINE SODIUM 150 MC: 150 | 45 days supply | Qty: 45 | Fill #1

## 2020-08-22 MED FILL — ACYCLOVIR 400 MG TABLET: 400 | 10 days supply | Qty: 30 | Fill #0

## 2020-08-23 ENCOUNTER — Other Ambulatory Visit: Payer: Self-pay | Admitting: Internal Medicine

## 2020-08-23 ENCOUNTER — Other Ambulatory Visit: Payer: Self-pay

## 2020-08-23 DIAGNOSIS — E063 Autoimmune thyroiditis: Secondary | ICD-10-CM | POA: Diagnosis not present

## 2020-08-23 DIAGNOSIS — E038 Other specified hypothyroidism: Secondary | ICD-10-CM | POA: Diagnosis not present

## 2020-08-23 LAB — TSH: TSH: 3.74 u[IU]/mL (ref 0.35–4.50)

## 2020-08-23 LAB — T4, FREE: Free T4: 0.75 ng/dL (ref 0.60–1.60)

## 2020-08-23 MED ORDER — LEVOTHYROXINE SODIUM 175 MCG PO TABS: ORAL_TABLET | ORAL | 3 refills | Status: DC

## 2020-08-23 MED ORDER — LEVOTHYROXINE SODIUM 150 MCG PO TABS: ORAL_TABLET | ORAL | 3 refills | Status: DC

## 2020-08-24 LAB — THYROID PEROXIDASE ANTIBODY: Thyroperoxidase Ab SerPl-aCnc: 172 IU/mL — ABNORMAL HIGH (ref <9)

## 2020-08-24 LAB — THYROGLOBULIN ANTIBODY: Thyroglobulin Ab: 3 IU/mL — ABNORMAL HIGH (ref <=1)

## 2020-08-29 ENCOUNTER — Ambulatory Visit: Payer: 59 | Admitting: Hospice and Palliative Medicine

## 2020-09-05 MED FILL — LEVOTHYROXINE 175 MCG TAB: 175 | 90 days supply | Qty: 45 | Fill #0

## 2020-09-27 ENCOUNTER — Ambulatory Visit (INDEPENDENT_AMBULATORY_CARE_PROVIDER_SITE_OTHER): Payer: 59 | Admitting: Hospice and Palliative Medicine

## 2020-09-27 ENCOUNTER — Other Ambulatory Visit: Payer: Self-pay | Admitting: Hospice and Palliative Medicine

## 2020-09-27 ENCOUNTER — Encounter: Payer: Self-pay | Admitting: Hospice and Palliative Medicine

## 2020-09-27 VITALS — BP 138/78 | HR 82 | Temp 97.8°F | Resp 16 | Ht 63.0 in | Wt 175.0 lb

## 2020-09-27 DIAGNOSIS — R5383 Other fatigue: Secondary | ICD-10-CM

## 2020-09-27 DIAGNOSIS — I10 Essential (primary) hypertension: Secondary | ICD-10-CM | POA: Diagnosis not present

## 2020-09-27 DIAGNOSIS — E039 Hypothyroidism, unspecified: Secondary | ICD-10-CM | POA: Diagnosis not present

## 2020-09-27 MED ORDER — AMLODIPINE BESYLATE 10 MG PO TABS: 10.0000 mg | ORAL_TABLET | Freq: Every day | ORAL | 1 refills | Status: DC

## 2020-09-27 MED ORDER — HYDROCHLOROTHIAZIDE 25 MG PO TABS: 25.0000 mg | ORAL_TABLET | Freq: Every day | ORAL | 1 refills | Status: DC

## 2020-09-27 NOTE — Progress Notes (Signed)
[facility] [place], [place] [address]  Internal MEDICINE  Office Visit Note  Patient Name: Brenda Schneider  [id number]  [id number]  Date of Service: 09/29/2020  Chief Complaint  Patient presents with  . Hypertension  . Hypothyroidism    HPI Patient is here for routine follow-up Finally completely recovered from URI--negative COVID testing, prolonged occurrence of symptoms Back to work full-time, energy levels back to normal  Followed by endocrinology for hypothyroidism, levels stable  Due for screening mammogram, due to recent illness has not yet scheduled yet  Current Medication: Outpatient Encounter Medications as of 09/27/2020  Medication Sig  . acyclovir (ZOVIRAX) 400 MG tablet Take 1 tablet (400 mg total) by mouth 3 (three) times daily.  Marland Kitchen albuterol (VENTOLIN HFA) 108 (90 Base) MCG/ACT inhaler Inhale 2 puffs into the lungs every 6 (six) hours as needed for wheezing or shortness of breath.  . levothyroxine (SYNTHROID) 150 MCG tablet Take by mouth every other day  . levothyroxine (SYNTHROID) 175 MCG tablet Take by mouth every other day  . oxymetazoline (AFRIN) 0.05 % nasal spray Place 1 spray into both nostrils as needed for congestion.  . selenium 50 MCG TABS tablet Take 50 mcg by mouth daily.  . [DISCONTINUED] amLODipine (NORVASC) 10 MG tablet Take 1 tablet (10 mg total) by mouth daily.  . [DISCONTINUED] amoxicillin-clavulanate (AUGMENTIN) 875-125 MG tablet Take 1 tablet by mouth 2 (two) times daily.  . [DISCONTINUED] azithromycin (ZITHROMAX Z-PAK) 250 MG tablet Take two 250 mg tablets on day one followed by one 250 mg tablet each day for four days.  . [DISCONTINUED] chlorpheniramine-HYDROcodone (TUSSIONEX PENNKINETIC ER) 10-8 MG/5ML SUER Take 5 mLs by mouth every 12 (twelve) hours as needed for cough.  . [DISCONTINUED] hydrochlorothiazide (HYDRODIURIL) 25 MG tablet Take 1 tablet (25 mg total) by mouth daily.  . [DISCONTINUED] phentermine 37.5 MG  capsule Take 1 capsule (37.5 mg total) by mouth every morning.  . [DISCONTINUED] predniSONE (DELTASONE) 10 MG tablet Take 1 tablet three times a day with a meal for three for three days, take 1 tablet by twice daily with a meal for 3 days, take 1 tablet once daily with a meal for 3 days  . amLODipine (NORVASC) 10 MG tablet Take 1 tablet (10 mg total) by mouth daily.  . hydrochlorothiazide (HYDRODIURIL) 25 MG tablet Take 1 tablet (25 mg total) by mouth daily.   No facility-administered encounter medications on file as of 09/27/2020.    Surgical History: Past Surgical History:  Procedure Laterality Date  . HYSTEROSCOPY WITH D & C  2007  . LAPAROSCOPIC CHOLECYSTECTOMY  Feb 1999  . LAPAROSCOPIC VAGINAL HYSTERECTOMY WITH SALPINGECTOMY Bilateral 11/01/2015   Procedure: LAPAROSCOPIC ASSISTED VAGINAL HYSTERECTOMY WITH SALPINGECTOMY;  Surgeon: Dian Queen, MD;  Location: [facility];  Service: Gynecology;  Laterality: Bilateral;  VAGINAL  . TONSILLECTOMY  1991  . TUBAL LIGATION  Mar 1999  . TYMPANOSTOMY TUBE PLACEMENT Bilateral 1980's    Medical History: Past Medical History:  Diagnosis Date  . 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 Hypertension   . Menorrhagia   . Uterine fibroid     Family History: Family History  Problem Relation Age of Onset  . Cancer Mother   . Thyroid disease Paternal Grandmother   . Heart disease Paternal Grandmother   . Diabetes Neg Hx     Social History   Socioeconomic History  . Marital status: Married  Spouse name: Not on file  . Number of children: Not on file  . Years of education: Not on file  . Highest education level: Not on file  Occupational History  . Not on file  Tobacco Use  . Smoking status: Never Smoker  . Smokeless tobacco: Never Used  Substance and Sexual Activity  . Alcohol use: Never  . Drug use: No  . Sexual activity: Not on file   Other Topics Concern  . Not on file  Social History Narrative  . Not on file   Social Determinants of Health   Financial Resource Strain: Not on file  Food Insecurity: Not on file  Transportation Needs: Not on file  Physical Activity: Not on file  Stress: Not on file  Social Connections: Not on file  Intimate Partner Violence: Not on file      Review of Systems  Constitutional: Negative for chills, diaphoresis and fatigue.  HENT: Negative for ear pain, postnasal drip and sinus pressure.   Eyes: Negative for photophobia, discharge, redness, itching and visual disturbance.  Respiratory: Negative for cough, shortness of breath and wheezing.   Cardiovascular: Negative for chest pain, palpitations and leg swelling.  Gastrointestinal: Negative for abdominal pain, constipation, diarrhea, nausea and vomiting.  Genitourinary: Negative for dysuria and flank pain.  Musculoskeletal: Negative for arthralgias, back pain, gait problem and neck pain.  Skin: Negative for color change.  Allergic/Immunologic: Negative for environmental allergies and food allergies.  Neurological: Negative for dizziness and headaches.  Hematological: Does not bruise/bleed easily.  Psychiatric/Behavioral: Negative for agitation, behavioral problems (depression) and hallucinations.    Vital Signs: BP 138/78   Pulse 82   Temp 97.8 F (36.6 C)   Resp 16   Ht 5\' 3"  (1.6 m)   Wt 175 lb (79.4 kg)   LMP 10/08/2015 (Exact Date)   SpO2 98%   BMI 31.00 kg/m    Physical Exam Vitals reviewed.  Constitutional:      Appearance: Normal appearance. She is normal weight.  Cardiovascular:     Rate and Rhythm: Normal rate and regular rhythm.     Pulses: Normal pulses.     Heart sounds: Normal heart sounds.  Pulmonary:     Effort: Pulmonary effort is normal.     Breath sounds: Normal breath sounds.  Abdominal:     General: Abdomen is flat.     Palpations: Abdomen is soft.  Musculoskeletal:        General:  Normal range of motion.     Cervical back: Normal range of motion.  Skin:    General: Skin is warm.  Neurological:     General: No focal deficit present.     Mental Status: She is alert and oriented to person, place, and time. Mental status is at baseline.  Psychiatric:        Mood and Affect: Mood normal.        Behavior: Behavior normal.        Thought Content: Thought content normal.        Judgment: Judgment normal.    Assessment/Plan: 1. Essential hypertension BP and HR well controlled, requesting refills - amLODipine (NORVASC) 10 MG tablet; Take 1 tablet (10 mg total) by mouth daily.  Dispense: 90 tablet; Refill: 1 - hydrochlorothiazide (HYDRODIURIL) 25 MG tablet; Take 1 tablet (25 mg total) by mouth daily.  Dispense: 90 tablet; Refill: 1 - Comprehensive Metabolic Panel (CMET)  2. Hypothyroidism, unspecified type Followed by endocrinology - TSH + free T4  3.  Other fatigue - CBC w/Diff/Platelet - Comprehensive Metabolic Panel (CMET) - Lipid Panel With LDL/HDL Ratio - TSH + free T4  General Counseling: Marua verbalizes understanding of the findings of todays visit and agrees with plan of treatment. I have discussed any further diagnostic evaluation that may be needed or ordered today. We also reviewed her medications today. she has been encouraged to call the office with any questions or concerns that should arise related to todays visit.    Orders Placed This Encounter  Procedures  . CBC w/Diff/Platelet  . Comprehensive Metabolic Panel (CMET)  . Lipid Panel With LDL/HDL Ratio  . TSH + free T4    Meds ordered this encounter  Medications  . amLODipine (NORVASC) 10 MG tablet    Sig: Take 1 tablet (10 mg total) by mouth daily.    Dispense:  90 tablet    Refill:  1  . hydrochlorothiazide (HYDRODIURIL) 25 MG tablet    Sig: Take 1 tablet (25 mg total) by mouth daily.    Dispense:  90 tablet    Refill:  1    Time spent: 30 Minutes Time spent includes review of  chart, medications, test results and follow-up plan with the patient.  This patient was seen by Theodoro Grist AGNP-C in Collaboration with Dr Lavera Guise as a part of collaborative care agreement     Tanna Furry. Brithney Bensen AGNP-C Internal medicine

## 2020-09-28 ENCOUNTER — Other Ambulatory Visit: Payer: 59

## 2020-09-29 ENCOUNTER — Encounter: Payer: Self-pay | Admitting: Hospice and Palliative Medicine

## 2020-10-07 ENCOUNTER — Encounter: Payer: Self-pay | Admitting: Hospice and Palliative Medicine

## 2020-10-08 ENCOUNTER — Encounter: Payer: Self-pay | Admitting: Hospice and Palliative Medicine

## 2020-10-08 ENCOUNTER — Other Ambulatory Visit: Payer: Self-pay

## 2020-10-08 ENCOUNTER — Ambulatory Visit: Payer: 59 | Admitting: Hospice and Palliative Medicine

## 2020-10-08 ENCOUNTER — Ambulatory Visit
Admission: RE | Admit: 2020-10-08 | Discharge: 2020-10-08 | Disposition: A | Discharge status: Home / Self Care | Payer: 59 | Source: Ambulatory Visit | Attending: Hospice and Palliative Medicine | Admitting: Hospice and Palliative Medicine

## 2020-10-08 VITALS — Ht 63.0 in | Wt 175.0 lb

## 2020-10-08 DIAGNOSIS — S0990XA Unspecified injury of head, initial encounter: Secondary | ICD-10-CM

## 2020-10-08 DIAGNOSIS — S6992XA Unspecified injury of left wrist, hand and finger(s), initial encounter: Secondary | ICD-10-CM | POA: Diagnosis not present

## 2020-10-08 NOTE — Telephone Encounter (Signed)
Can u schedule a virtual visit this afternoon with TH

## 2020-10-08 NOTE — Progress Notes (Signed)
Called and discussed results of CT scan.

## 2020-10-08 NOTE — Progress Notes (Signed)
[facility] [place], [place] [address]  Internal MEDICINE  Telephone Visit  Patient Name: Brenda Schneider  [id number]  [id number]  Date of Service: 10/09/2020  I connected with the patient at 1406 by telephone and verified the patients identity using two identifiers.   I discussed the limitations, risks, security and privacy concerns of performing an evaluation and management service by telephone and the availability of in person appointments. I also discussed with the patient that there may be a patient responsible charge related to the service.  The patient expressed understanding and agrees to proceed.    Chief Complaint  Patient presents with  . Telephone Screen    [id number]  . Telephone Assessment  . Dizziness    Hit with golf ball  . Headache  . Ear Problem    Ringing in both ears    HPI Patient being seen virtually today for acute sick visit Walking around her neighborhood yesterday and was hit by golf ball Ball initial impact to left side of her head above left ear--ball then bounced off side walk and hit her in left hand and wrist Immediate pain to both her head and hand and swelling to hand Over the night she developed dizziness, headache and blurred vision This morning she has developed bilateral ringing in her ears Also complaining of fatigue this morning--can only complete 1-2 small tasks and needs to sit down to rest Denies any further neurological changes--weakness to one side or difficulty with speech or swallowing    Current Medication: Outpatient Encounter Medications as of 10/08/2020  Medication Sig  . acyclovir (ZOVIRAX) 400 MG tablet Take 1 tablet (400 mg total) by mouth 3 (three) times daily.  Marland Kitchen albuterol (VENTOLIN HFA) 108 (90 Base) MCG/ACT inhaler Inhale 2 puffs into the lungs every 6 (six) hours as needed for wheezing or shortness of breath.  Marland Kitchen amLODipine (NORVASC) 10 MG tablet Take 1 tablet (10 mg total) by mouth daily.  .  hydrochlorothiazide (HYDRODIURIL) 25 MG tablet Take 1 tablet (25 mg total) by mouth daily.  Marland Kitchen levothyroxine (SYNTHROID) 150 MCG tablet Take by mouth every other day  . levothyroxine (SYNTHROID) 175 MCG tablet Take by mouth every other day  . oxymetazoline (AFRIN) 0.05 % nasal spray Place 1 spray into both nostrils as needed for congestion.  . selenium 50 MCG TABS tablet Take 50 mcg by mouth daily.   No facility-administered encounter medications on file as of 10/08/2020.    Surgical History: Past Surgical History:  Procedure Laterality Date  . HYSTEROSCOPY WITH D & C  2007  . LAPAROSCOPIC CHOLECYSTECTOMY  Feb 1999  . LAPAROSCOPIC VAGINAL HYSTERECTOMY WITH SALPINGECTOMY Bilateral 11/01/2015   Procedure: LAPAROSCOPIC ASSISTED VAGINAL HYSTERECTOMY WITH SALPINGECTOMY;  Surgeon: Dian Queen, MD;  Location: [facility];  Service: Gynecology;  Laterality: Bilateral;  VAGINAL  . TONSILLECTOMY  1991  . TUBAL LIGATION  Mar 1999  . TYMPANOSTOMY TUBE PLACEMENT Bilateral 1980's    Medical History: Past Medical History:  Diagnosis Date  . 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 Hypertension   . Menorrhagia   . Uterine fibroid     Family History: Family History  Problem Relation Age of Onset  . Cancer Mother   . Thyroid disease Paternal Grandmother   . Heart disease Paternal Grandmother   . Diabetes Neg Hx     Social History   Socioeconomic History  . Marital status:  Married    Spouse name: Not on file  . Number of children: Not on file  . Years of education: Not on file  . Highest education level: Not on file  Occupational History  . Not on file  Tobacco Use  . Smoking status: Never Smoker  . Smokeless tobacco: Never Used  Substance and Sexual Activity  . Alcohol use: Never  . Drug use: No  . Sexual activity: Not on file  Other Topics Concern  . Not on file  Social History  Narrative  . Not on file   Social Determinants of Health   Financial Resource Strain: Not on file  Food Insecurity: Not on file  Transportation Needs: Not on file  Physical Activity: Not on file  Stress: Not on file  Social Connections: Not on file  Intimate Partner Violence: Not on file      Review of Systems  Constitutional: Negative for chills, diaphoresis and fatigue.  HENT: Negative for ear pain, postnasal drip and sinus pressure.   Eyes: Positive for photophobia and visual disturbance. Negative for discharge, redness and itching.  Respiratory: Negative for cough, shortness of breath and wheezing.   Cardiovascular: Negative for chest pain, palpitations and leg swelling.  Gastrointestinal: Negative for abdominal pain, constipation, diarrhea, nausea and vomiting.  Genitourinary: Negative for dysuria and flank pain.  Musculoskeletal: Negative for arthralgias, back pain, gait problem and neck pain.       Pain and swelling to left hand  Skin: Negative for color change.  Allergic/Immunologic: Negative for environmental allergies and food allergies.  Neurological: Positive for dizziness and headaches.  Hematological: Does not bruise/bleed easily.  Psychiatric/Behavioral: Negative for agitation, behavioral problems (depression) and hallucinations.    Vital Signs: Ht 5\' 3"  (1.6 m)   Wt 175 lb (79.4 kg)   LMP 10/08/2015 (Exact Date)   BMI 31.00 kg/m    Observation/Objective: Alert and oriented, answer questions appropriately, no difficulty noted with speech. No evidence of acute distress.  Assessment/Plan: 1. Traumatic injury of head, initial encounter Stat CT scan--will review and treat accordingly Advised that if new or worsening neurological symptoms develop she needs to seek emergency department care - CT Head Wo Contrast; Future  2. Hand trauma, left, initial encounter Continue with ice and elevation--consider imaging of hand if pain and swelling persists  General  Counseling: Jonia verbalizes understanding of the findings of today's phone visit and agrees with plan of treatment. I have discussed any further diagnostic evaluation that may be needed or ordered today. We also reviewed her medications today. she has been encouraged to call the office with any questions or concerns that should arise related to todays visit.    Orders Placed This Encounter  Procedures  . CT Head Wo Contrast    Time spent: 25 Minutes Time spent includes review of chart, medications, test results and follow-up plan with the patient.  Tanna Furry Semira Stoltzfus AGNP-C Internal medicine

## 2020-10-09 ENCOUNTER — Encounter: Payer: Self-pay | Admitting: Hospice and Palliative Medicine

## 2020-10-09 ENCOUNTER — Other Ambulatory Visit (HOSPITAL_BASED_OUTPATIENT_CLINIC_OR_DEPARTMENT_OTHER): Payer: Self-pay

## 2020-10-10 ENCOUNTER — Other Ambulatory Visit: Payer: Self-pay | Admitting: Hospice and Palliative Medicine

## 2020-10-10 DIAGNOSIS — S0990XA Unspecified injury of head, initial encounter: Secondary | ICD-10-CM

## 2020-10-10 MED ORDER — DULOXETINE HCL 20 MG PO CPEP: 20.0000 mg | ORAL_CAPSULE | Freq: Every day | ORAL | 0 refills | Status: DC

## 2020-10-15 ENCOUNTER — Telehealth: Payer: Self-pay

## 2020-10-15 ENCOUNTER — Ambulatory Visit (INDEPENDENT_AMBULATORY_CARE_PROVIDER_SITE_OTHER): Payer: 59 | Admitting: Otolaryngology

## 2020-10-15 NOTE — Telephone Encounter (Signed)
Patient had a np appt scheduled to Dr. Jennell Corner office,but patient received a call from tat office stating they needed to cancel her upcoming appointmemt due to a billing issue that they would call her back to reschedule. Brenda Schneider

## 2020-11-02 ENCOUNTER — Ambulatory Visit (INDEPENDENT_AMBULATORY_CARE_PROVIDER_SITE_OTHER): Payer: 59 | Admitting: Otolaryngology

## 2020-11-06 ENCOUNTER — Other Ambulatory Visit (HOSPITAL_COMMUNITY): Payer: Self-pay

## 2020-11-06 ENCOUNTER — Encounter: Payer: Self-pay | Admitting: Hospice and Palliative Medicine

## 2020-11-06 ENCOUNTER — Other Ambulatory Visit: Payer: Self-pay | Admitting: Hospice and Palliative Medicine

## 2020-11-06 ENCOUNTER — Other Ambulatory Visit: Payer: Self-pay

## 2020-11-06 MED ORDER — DULOXETINE HCL 20 MG PO CPEP
20.0000 mg | ORAL_CAPSULE | Freq: Every day | ORAL | 1 refills | Status: DC
  Filled 2020-11-06 (×2): qty 30, 30d supply, fill #0
  Filled 2020-12-10: qty 30, 30d supply, fill #1

## 2020-11-18 MED FILL — Levothyroxine Sodium Tab 175 MCG: ORAL | 90 days supply | Qty: 45 | Fill #0 | Status: AC

## 2020-11-19 ENCOUNTER — Other Ambulatory Visit (HOSPITAL_COMMUNITY): Payer: Self-pay

## 2020-11-26 ENCOUNTER — Encounter: Payer: Self-pay | Admitting: Internal Medicine

## 2020-11-26 ENCOUNTER — Other Ambulatory Visit: Payer: Self-pay

## 2020-11-26 ENCOUNTER — Ambulatory Visit (INDEPENDENT_AMBULATORY_CARE_PROVIDER_SITE_OTHER): Payer: 59 | Admitting: Internal Medicine

## 2020-11-26 VITALS — BP 150/90 | HR 81 | Ht 63.0 in | Wt 182.4 lb

## 2020-11-26 DIAGNOSIS — E041 Nontoxic single thyroid nodule: Secondary | ICD-10-CM | POA: Diagnosis not present

## 2020-11-26 DIAGNOSIS — E063 Autoimmune thyroiditis: Secondary | ICD-10-CM

## 2020-11-26 DIAGNOSIS — E038 Other specified hypothyroidism: Secondary | ICD-10-CM
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
## 2020-11-26 NOTE — Patient Instructions (Signed)
Please continue alternating levothyroxine 150 with 175 mcg every other day.  Take the thyroid hormone every day, with water, at least 30 minutes before breakfast, separated by at least 4 hours from: - acid reflux medications - calcium - iron - multivitamins  Please stop at the lab.  Please come back for another appointment in 1 year.

## 2020-11-27 ENCOUNTER — Other Ambulatory Visit (HOSPITAL_COMMUNITY): Payer: Self-pay

## 2020-11-27 ENCOUNTER — Encounter: Payer: Self-pay | Admitting: Internal Medicine

## 2020-11-27 DIAGNOSIS — E063 Autoimmune thyroiditis: Secondary | ICD-10-CM

## 2020-11-27 LAB — TSH: TSH: 2.25 u[IU]/mL (ref 0.35–4.50)

## 2020-11-27 LAB — T4, FREE: Free T4: 1.01 ng/dL (ref 0.60–1.60)

## 2020-11-27 MED ORDER — LEVOTHYROXINE SODIUM 150 MCG PO TABS
ORAL_TABLET | ORAL | 3 refills | Status: DC
  Filled 2020-11-27: qty 45, 90d supply, fill #0
  Filled 2021-03-11: qty 45, 90d supply, fill #1
  Filled 2021-06-20: qty 45, 90d supply, fill #2
  Filled 2021-10-03: qty 45, 90d supply, fill #3

## 2020-12-10 MED FILL — Acyclovir Tab 400 MG: ORAL | 10 days supply | Qty: 30 | Fill #0 | Status: AC

## 2020-12-11 ENCOUNTER — Other Ambulatory Visit (HOSPITAL_COMMUNITY): Payer: Self-pay

## 2021-01-02 ENCOUNTER — Encounter: Payer: Self-pay | Admitting: Physician Assistant

## 2021-01-04 ENCOUNTER — Ambulatory Visit: Payer: 59 | Admitting: Physician Assistant

## 2021-01-08 ENCOUNTER — Other Ambulatory Visit (HOSPITAL_BASED_OUTPATIENT_CLINIC_OR_DEPARTMENT_OTHER): Payer: Self-pay

## 2021-01-11 ENCOUNTER — Other Ambulatory Visit (HOSPITAL_COMMUNITY): Payer: Self-pay

## 2021-01-11 MED FILL — Hydrochlorothiazide Tab 25 MG: ORAL | 90 days supply | Qty: 90 | Fill #0 | Status: AC

## 2021-01-11 MED FILL — Amlodipine Besylate Tab 10 MG (Base Equivalent): ORAL | 90 days supply | Qty: 90 | Fill #0 | Status: AC

## 2021-01-30 ENCOUNTER — Other Ambulatory Visit: Payer: Self-pay

## 2021-02-05 ENCOUNTER — Other Ambulatory Visit: Payer: Self-pay | Admitting: Internal Medicine

## 2021-02-05 ENCOUNTER — Encounter: Payer: Self-pay | Admitting: Internal Medicine

## 2021-02-05 ENCOUNTER — Other Ambulatory Visit: Payer: Self-pay

## 2021-02-05 ENCOUNTER — Other Ambulatory Visit (HOSPITAL_COMMUNITY): Payer: Self-pay

## 2021-02-05 ENCOUNTER — Ambulatory Visit: Payer: 59 | Admitting: Internal Medicine

## 2021-02-05 VITALS — BP 160/88 | HR 70 | Temp 98.6°F | Ht 63.0 in | Wt 180.0 lb

## 2021-02-05 DIAGNOSIS — Z6831 Body mass index (BMI) 31.0-31.9, adult: Secondary | ICD-10-CM | POA: Diagnosis not present

## 2021-02-05 DIAGNOSIS — Z1231 Encounter for screening mammogram for malignant neoplasm of breast: Secondary | ICD-10-CM

## 2021-02-05 DIAGNOSIS — Z0001 Encounter for general adult medical examination with abnormal findings: Secondary | ICD-10-CM

## 2021-02-05 DIAGNOSIS — E6609 Other obesity due to excess calories: Secondary | ICD-10-CM

## 2021-02-05 DIAGNOSIS — Z1159 Encounter for screening for other viral diseases: Secondary | ICD-10-CM | POA: Diagnosis not present

## 2021-02-05 DIAGNOSIS — I10 Essential (primary) hypertension: Secondary | ICD-10-CM | POA: Diagnosis not present

## 2021-02-05 DIAGNOSIS — Z1211 Encounter for screening for malignant neoplasm of colon: Secondary | ICD-10-CM

## 2021-02-05 DIAGNOSIS — E559 Vitamin D deficiency, unspecified: Secondary | ICD-10-CM | POA: Diagnosis not present

## 2021-02-05 MED ORDER — SAXENDA 18 MG/3ML ~~LOC~~ SOPN
3.0000 mg | PEN_INJECTOR | Freq: Every day | SUBCUTANEOUS | 5 refills | Status: DC
  Filled 2021-02-05: qty 15, 30d supply, fill #0
  Filled 2021-03-25: qty 15, 30d supply, fill #1
  Filled 2021-04-26: qty 15, 30d supply, fill #2
  Filled 2021-07-20 – 2021-07-24 (×2): qty 15, 30d supply, fill #3

## 2021-02-05 MED ORDER — INSULIN PEN NEEDLE 32G X 6 MM MISC
1.0000 | 0 refills | Status: DC
  Filled 2021-02-05: qty 100, fill #0

## 2021-02-05 MED ORDER — OLMESARTAN MEDOXOMIL 20 MG PO TABS
20.0000 mg | ORAL_TABLET | Freq: Every day | ORAL | 0 refills | Status: DC
  Filled 2021-02-05: qty 90, 90d supply, fill #0

## 2021-02-05 MED ORDER — INSULIN PEN NEEDLE 32G X 6 MM MISC
1.0000 | Freq: Every day | 1 refills | Status: DC
  Filled 2021-02-05 – 2021-02-13 (×2): qty 100, 90d supply, fill #0

## 2021-02-05 MED ORDER — SEMAGLUTIDE-WEIGHT MANAGEMENT 0.25 MG/0.5ML ~~LOC~~ SOAJ
0.2500 mg | SUBCUTANEOUS | 0 refills | Status: DC
Start: 2021-02-05 — End: 2021-02-05
  Filled 2021-02-05: qty 2, fill #0

## 2021-02-05 NOTE — Patient Instructions (Signed)

## 2021-02-05 NOTE — Progress Notes (Signed)
Subjective:  Patient ID: Brenda Schneider, female    DOB: [birth year]  Age: 50 y.o. MRN: [id number]  CC: Annual Exam and Hypertension  This visit occurred during the SARS-CoV-2 public health emergency.  Safety protocols were in place, including screening questions prior to the visit, additional usage of staff PPE, and extensive cleaning of exam room while observing appropriate contact time as indicated for disinfecting solutions.    HPI Brenda Schneider presents for a CPX and to establish.  She has had a murmur since childhood.  She has a history of hypertension that has been treated with amlodipine and hydrochlorothiazide.  This has been complicated by hypokalemia.  She is active and denies any recent episodes of chest pain, shortness of breath, diaphoresis, dizziness, lightheadedness, or edema.  History Brenda Schneider has a past medical history of Acquired autoimmune hypothyroidism (followed by dr Dwyane Dee), Hypertension, Menorrhagia, Murmur (02/03/1980), and Uterine fibroid.   She has a past surgical history that includes Laparoscopic cholecystectomy (Feb 1999); Tubal ligation (Mar 1999); Tonsillectomy (1991); Tympanostomy tube placement (Bilateral, 1980's); Hysteroscopy with D & C (2007); and Laparoscopic vaginal hysterectomy with salpingectomy (Bilateral, 11/01/2015).   Her family history includes Asthma in her brother; COPD in her paternal grandmother; Cancer in her maternal grandfather, maternal grandmother, and mother; Early death in an other family member; Hearing loss in her father; Heart disease in her paternal grandfather, paternal grandmother, and another family member; Hyperlipidemia in her maternal grandfather, paternal grandfather, paternal grandmother, and another family member; Hypertension in her brother, father, maternal grandfather, mother, paternal grandfather, paternal grandmother, and another family member; Thyroid disease in her paternal grandmother.She reports that she has never smoked.  She has never used smokeless tobacco. She reports that she does not drink alcohol and does not use drugs.  Outpatient Medications Prior to Visit  Medication Sig Dispense Refill   acyclovir (ZOVIRAX) 400 MG tablet TAKE 1 TABLET BY MOUTH 3 TIMES DAILY 30 tablet 1   albuterol (VENTOLIN HFA) 108 (90 Base) MCG/ACT inhaler INHALE 2 PUFFS BY MOUTH EVERY 6 HOURS AS NEEDED FOR WHEEZING OR SHORTNESS OF BREATH. 18 g 0   amLODipine (NORVASC) 10 MG tablet TAKE 1 TABLET BY MOUTH ONCE DAILY 90 tablet 1   hydrochlorothiazide (HYDRODIURIL) 25 MG tablet TAKE 1 TABLET (25 MG TOTAL) BY MOUTH DAILY. 90 tablet 1   levothyroxine (SYNTHROID) 150 MCG tablet TAKE 1 TABLET BY MOUTH EVERY OTHER DAY 45 tablet 3   levothyroxine (SYNTHROID) 175 MCG tablet TAKE 1 TABLET BY MOUTH EVERY OTHER DAY 45 tablet 3   selenium 50 MCG TABS tablet Take 50 mcg by mouth daily.     oxymetazoline (AFRIN) 0.05 % nasal spray Place 1 spray into both nostrils as needed for congestion.     DULoxetine (CYMBALTA) 20 MG capsule Take 1 capsule (20 mg total) by mouth daily. 30 capsule 1   No facility-administered medications prior to visit.    ROS Review of Systems  Constitutional:  Negative for appetite change, chills, diaphoresis, fatigue and fever.  HENT: Negative.    Eyes: Negative.   Respiratory:  Negative for apnea, cough, chest tightness, shortness of breath and wheezing.   Cardiovascular:  Negative for chest pain, palpitations and leg swelling.  Gastrointestinal:  Positive for constipation. Negative for abdominal pain, diarrhea, nausea and vomiting.  Endocrine: Negative.   Genitourinary: Negative.  Negative for difficulty urinating.  Musculoskeletal:  Negative for arthralgias and myalgias.  Skin: Negative.   Neurological: Negative.  Negative for dizziness, weakness, numbness and headaches.  Hematological:  Negative for adenopathy. Does not bruise/bleed easily.  Psychiatric/Behavioral: Negative.     Objective:  BP (!) 160/88 (BP  Location: Left Arm, Patient Position: Sitting, Cuff Size: Large)   Pulse 70   Temp 98.6 F (37 C) (Oral)   Ht 5\' 3"  (1.6 m)   Wt 180 lb (81.6 kg)   LMP 10/08/2015 (Exact Date)   SpO2 98%   BMI 31.89 kg/m   Physical Exam Vitals reviewed.  Constitutional:      Appearance: Normal appearance.  HENT:     Nose: Nose normal.     Mouth/Throat:     Mouth: Mucous membranes are moist.  Eyes:     Conjunctiva/sclera: Conjunctivae normal.  Cardiovascular:     Rate and Rhythm: Normal rate and regular rhythm.     Heart sounds: Murmur heard.  Systolic murmur is present with a grade of 1/6.     Comments: EKG- NSR, 69 bpm ??? LVH in I, II, V1, and V2 No Q waves Pulmonary:     Effort: Pulmonary effort is normal.     Breath sounds: No stridor. No wheezing, rhonchi or rales.  Abdominal:     General: Abdomen is flat. Bowel sounds are normal. There is no distension.     Palpations: Abdomen is soft. There is no hepatomegaly, splenomegaly or mass.     Tenderness: There is no abdominal tenderness. There is no guarding.  Musculoskeletal:        General: Normal range of motion.     Cervical back: Neck supple.     Right lower leg: No edema.     Left lower leg: No edema.  Lymphadenopathy:     Cervical: No cervical adenopathy.  Skin:    General: Skin is warm and dry.     Coloration: Skin is not pale.  Neurological:     General: No focal deficit present.     Mental Status: She is alert.  Psychiatric:        Mood and Affect: Mood normal.        Behavior: Behavior normal.    Lab Results  Component Value Date   WBC 7.9 02/05/2021   HGB 13.1 02/05/2021   HCT 38.2 02/05/2021   PLT 291.0 02/05/2021   GLUCOSE 84 02/05/2021   CHOL 231 (H) 02/05/2021   TRIG 163.0 (H) 02/05/2021   HDL 58.40 02/05/2021   LDLCALC 140 (H) 02/05/2021   ALT 20 02/05/2021   AST 20 02/05/2021   NA 138 02/05/2021   K 3.5 02/05/2021   CL 101 02/05/2021   CREATININE 0.68 02/05/2021   BUN 16 02/05/2021   CO2 29  02/05/2021   TSH 2.25 11/26/2020     Assessment & Plan:   Brenda Schneider was seen today for annual exam and hypertension.  Diagnoses and all orders for this visit:  Essential hypertension- Her blood pressure is not adequately well controlled.  Will add an ARB to her current regimen.  Will check her labs to screen for endorgan damage and secondary causes. -     EKG 12-Lead -     CBC with Differential/Platelet; Future -     Basic metabolic panel; Future -     Hepatic function panel; Future -     Cancel: TSH; Future -     Urinalysis, Routine w reflex microscopic; Future -     VITAMIN D 25 Hydroxy (Vit-D Deficiency, Fractures); Future -     Aldosterone + renin activity w/ ratio; Future -  Aldosterone + renin activity w/ ratio -     VITAMIN D 25 Hydroxy (Vit-D Deficiency, Fractures) -     Urinalysis, Routine w reflex microscopic -     Cancel: TSH -     Hepatic function panel -     Basic metabolic panel -     CBC with Differential/Platelet -     olmesartan (BENICAR) 20 MG tablet; Take 1 tablet (20 mg total) by mouth daily. -     potassium chloride (KLOR-CON 10) 10 MEQ tablet; Take 1 tablet (10 mEq total) by mouth 2 (two) times daily.  Encounter for general adult medical examination with abnormal findings- Exam completed, labs reviewed, vaccines reviewed, cancer screenings addressed, patient education was given. -     Lipid panel; Future -     Hepatitis C antibody; Future -     HIV Antibody (routine testing w rflx); Future -     Hepatitis C antibody -     HIV Antibody (routine testing w rflx) -     Lipid panel  Need for hepatitis C screening test -     Hepatitis C antibody; Future -     Hepatitis C antibody  Colon cancer screening -     Ambulatory referral to Gastroenterology  Visit for screening mammogram -     MM DIGITAL SCREENING BILATERAL; Future  Class 1 obesity due to excess calories with serious comorbidity and body mass index (BMI) of 31.0 to 31.9 in adult- In addition to  lifestyle modifications I recommended that she start using a GLP-1 agonist. -     Discontinue: Semaglutide-Weight Management 0.25 MG/0.5ML SOAJ; Inject 0.25 mg into the skin once a week. -     Discontinue: Insulin Pen Needle 32G X 6 MM MISC; use once weekly as directed -     Liraglutide -Weight Management (SAXENDA) 18 MG/3ML SOPN; Inject 3 mg into the skin daily. -     Insulin Pen Needle 32G X 6 MM MISC; Use as directed with Saxenda daily  Vitamin D deficiency disease -     Cholecalciferol 1.25 MG (50000 UT) capsule; Take 1 capsule (50,000 Units total) by mouth once a week.  I have discontinued Indiyah Cancio's oxymetazoline, DULoxetine, and Semaglutide-Weight Management. I have also changed her Insulin Pen Needle. Additionally, I am having her start on Saxenda, olmesartan, Cholecalciferol, and potassium chloride. Lastly, I am having her maintain her selenium, albuterol, amLODipine, hydrochlorothiazide, levothyroxine, acyclovir, and levothyroxine.  Meds ordered this encounter  Medications   DISCONTD: Semaglutide-Weight Management 0.25 MG/0.5ML SOAJ    Sig: Inject 0.25 mg into the skin once a week.    Dispense:  2 mL    Refill:  0   DISCONTD: Insulin Pen Needle 32G X 6 MM MISC    Sig: use once weekly as directed    Dispense:  50 each    Refill:  0   Liraglutide -Weight Management (SAXENDA) 18 MG/3ML SOPN    Sig: Inject 3 mg into the skin daily.    Dispense:  15 mL    Refill:  5   Insulin Pen Needle 32G X 6 MM MISC    Sig: Use as directed with Saxenda daily    Dispense:  100 each    Refill:  1   olmesartan (BENICAR) 20 MG tablet    Sig: Take 1 tablet (20 mg total) by mouth daily.    Dispense:  90 tablet    Refill:  0   Cholecalciferol 1.25 MG (50000 UT)  capsule    Sig: Take 1 capsule (50,000 Units total) by mouth once a week.    Dispense:  12 capsule    Refill:  1   potassium chloride (KLOR-CON 10) 10 MEQ tablet    Sig: Take 1 tablet (10 mEq total) by mouth 2 (two) times daily.     Dispense:  180 tablet    Refill:  1      Follow-up: Return in about 3 months (around 05/08/2021).  Scarlette Calico, MD

## 2021-02-06 ENCOUNTER — Other Ambulatory Visit (HOSPITAL_COMMUNITY): Payer: Self-pay

## 2021-02-06 ENCOUNTER — Encounter: Payer: Self-pay | Admitting: Internal Medicine

## 2021-02-06 DIAGNOSIS — E559 Vitamin D deficiency, unspecified: Secondary | ICD-10-CM | POA: Insufficient documentation

## 2021-02-06 LAB — BASIC METABOLIC PANEL
BUN: 16 mg/dL (ref 6–23)
CO2: 29 mEq/L (ref 19–32)
Calcium: 9.6 mg/dL (ref 8.4–10.5)
Chloride: 101 mEq/L (ref 96–112)
Creatinine, Ser: 0.68 mg/dL (ref 0.40–1.20)
GFR: 103.12 mL/min (ref >60.00)
Glucose, Bld: 84 mg/dL (ref 70–99)
Potassium: 3.5 mEq/L (ref 3.5–5.1)
Sodium: 138 mEq/L (ref 135–145)

## 2021-02-06 LAB — CBC WITH DIFFERENTIAL/PLATELET
Basophils Absolute: 0.1 10*3/uL (ref 0.0–0.1)
Basophils Relative: 1.1 % (ref 0.0–3.0)
Eosinophils Absolute: 0.3 10*3/uL (ref 0.0–0.7)
Eosinophils Relative: 4.2 % (ref 0.0–5.0)
HCT: 38.2 % (ref 36.0–46.0)
Hemoglobin: 13.1 g/dL (ref 12.0–15.0)
Lymphocytes Relative: 20.2 % (ref 12.0–46.0)
Lymphs Abs: 1.6 10*3/uL (ref 0.7–4.0)
MCHC: 34.4 g/dL (ref 30.0–36.0)
MCV: 81.3 fl (ref 78.0–100.0)
Monocytes Absolute: 0.4 10*3/uL (ref 0.1–1.0)
Monocytes Relative: 5.5 % (ref 3.0–12.0)
Neutro Abs: 5.5 10*3/uL (ref 1.4–7.7)
Neutrophils Relative %: 69 % (ref 43.0–77.0)
Platelets: 291 10*3/uL (ref 150.0–400.0)
RBC: 4.69 Mil/uL (ref 3.87–5.11)
RDW: 14.1 % (ref 11.5–15.5)
WBC: 7.9 10*3/uL (ref 4.0–10.5)

## 2021-02-06 LAB — URINALYSIS, ROUTINE W REFLEX MICROSCOPIC
Bilirubin Urine: NEGATIVE
Hgb urine dipstick: NEGATIVE
Ketones, ur: NEGATIVE
Leukocytes,Ua: NEGATIVE
Nitrite: NEGATIVE
RBC / HPF: NONE SEEN (ref 0–?)
Specific Gravity, Urine: 1.01 (ref 1.000–1.030)
Total Protein, Urine: NEGATIVE
Urine Glucose: NEGATIVE
Urobilinogen, UA: 0.2 (ref 0.0–1.0)
WBC, UA: NONE SEEN (ref 0–?)
pH: 7 (ref 5.0–8.0)

## 2021-02-06 LAB — HEPATIC FUNCTION PANEL
ALT: 20 U/L (ref 0–35)
AST: 20 U/L (ref 0–37)
Albumin: 4.8 g/dL (ref 3.5–5.2)
Alkaline Phosphatase: 66 U/L (ref 39–117)
Bilirubin, Direct: 0.1 mg/dL (ref 0.0–0.3)
Total Bilirubin: 0.4 mg/dL (ref 0.2–1.2)
Total Protein: 7.8 g/dL (ref 6.0–8.3)

## 2021-02-06 LAB — LIPID PANEL
Cholesterol: 231 mg/dL — ABNORMAL HIGH (ref 0–200)
HDL: 58.4 mg/dL (ref >39.00)
LDL Cholesterol: 140 mg/dL — ABNORMAL HIGH (ref 0–99)
NonHDL: 172.49
Total CHOL/HDL Ratio: 4
Triglycerides: 163 mg/dL — ABNORMAL HIGH (ref 0.0–149.0)
VLDL: 32.6 mg/dL (ref 0.0–40.0)

## 2021-02-06 LAB — VITAMIN D 25 HYDROXY (VIT D DEFICIENCY, FRACTURES): VITD: 19.5 ng/mL — ABNORMAL LOW (ref 30.00–100.00)

## 2021-02-06 MED ORDER — CHOLECALCIFEROL 1.25 MG (50000 UT) PO CAPS
50000.0000 [IU] | ORAL_CAPSULE | ORAL | 1 refills | Status: DC
  Filled 2021-02-06: qty 12, 84d supply, fill #0
  Filled 2021-04-26: qty 12, 84d supply, fill #1

## 2021-02-07 ENCOUNTER — Telehealth: Payer: Self-pay

## 2021-02-07 ENCOUNTER — Encounter: Payer: Self-pay | Admitting: Internal Medicine

## 2021-02-07 ENCOUNTER — Other Ambulatory Visit (HOSPITAL_COMMUNITY): Payer: Self-pay

## 2021-02-07 MED ORDER — POTASSIUM CHLORIDE ER 10 MEQ PO TBCR
10.0000 meq | EXTENDED_RELEASE_TABLET | Freq: Two times a day (BID) | ORAL | 1 refills | Status: DC
  Filled 2021-02-07: qty 180, 90d supply, fill #0

## 2021-02-07 NOTE — Telephone Encounter (Signed)
Key: Brenda Schneider

## 2021-02-08 ENCOUNTER — Other Ambulatory Visit (HOSPITAL_COMMUNITY): Payer: Self-pay

## 2021-02-11 ENCOUNTER — Other Ambulatory Visit (HOSPITAL_COMMUNITY): Payer: Self-pay

## 2021-02-11 ENCOUNTER — Encounter: Payer: Self-pay | Admitting: Internal Medicine

## 2021-02-12 ENCOUNTER — Other Ambulatory Visit (HOSPITAL_COMMUNITY): Payer: Self-pay

## 2021-02-12 NOTE — Telephone Encounter (Signed)
Approved 02/11/2021 to 06/13/2021

## 2021-02-14 ENCOUNTER — Other Ambulatory Visit (HOSPITAL_COMMUNITY): Payer: Self-pay

## 2021-02-16 LAB — HIV ANTIBODY (ROUTINE TESTING W REFLEX): HIV 1&2 Ab, 4th Generation: NONREACTIVE

## 2021-02-16 LAB — ALDOSTERONE + RENIN ACTIVITY W/ RATIO
ALDO / PRA Ratio: 5.4 Ratio (ref 0.9–28.9)
Aldosterone: 5 ng/dL
Renin Activity: 0.93 ng/mL/h (ref 0.25–5.82)

## 2021-02-16 LAB — HEPATITIS C ANTIBODY
Hepatitis C Ab: NONREACTIVE
SIGNAL TO CUT-OFF: 0.01 (ref <1.00)

## 2021-03-11 ENCOUNTER — Other Ambulatory Visit (HOSPITAL_COMMUNITY): Payer: Self-pay

## 2021-03-11 MED FILL — Levothyroxine Sodium Tab 175 MCG: ORAL | 90 days supply | Qty: 45 | Fill #1 | Status: AC

## 2021-03-26 ENCOUNTER — Other Ambulatory Visit (HOSPITAL_COMMUNITY): Payer: Self-pay

## 2021-03-27 ENCOUNTER — Other Ambulatory Visit (HOSPITAL_COMMUNITY): Payer: Self-pay

## 2021-03-30 ENCOUNTER — Other Ambulatory Visit (HOSPITAL_COMMUNITY): Payer: Self-pay

## 2021-04-01 ENCOUNTER — Other Ambulatory Visit: Payer: Self-pay

## 2021-04-26 ENCOUNTER — Other Ambulatory Visit: Payer: Self-pay

## 2021-04-29 ENCOUNTER — Other Ambulatory Visit (HOSPITAL_COMMUNITY): Payer: Self-pay

## 2021-04-30 ENCOUNTER — Other Ambulatory Visit (HOSPITAL_COMMUNITY): Payer: Self-pay

## 2021-05-02 ENCOUNTER — Other Ambulatory Visit: Payer: Self-pay | Admitting: Internal Medicine

## 2021-05-02 ENCOUNTER — Other Ambulatory Visit (HOSPITAL_COMMUNITY): Payer: Self-pay

## 2021-05-02 DIAGNOSIS — I10 Essential (primary) hypertension: Secondary | ICD-10-CM

## 2021-05-02 MED ORDER — AMLODIPINE BESYLATE 10 MG PO TABS
ORAL_TABLET | Freq: Every day | ORAL | 0 refills | Status: DC
  Filled 2021-05-02: qty 90, 90d supply, fill #0

## 2021-05-02 MED ORDER — HYDROCHLOROTHIAZIDE 25 MG PO TABS
ORAL_TABLET | Freq: Every day | ORAL | 0 refills | Status: DC
Start: 2021-05-02 — End: 2021-08-15
  Filled 2021-05-02: qty 90, 90d supply, fill #0

## 2021-05-06 ENCOUNTER — Other Ambulatory Visit (HOSPITAL_COMMUNITY): Payer: Self-pay

## 2021-05-07 ENCOUNTER — Other Ambulatory Visit (HOSPITAL_COMMUNITY): Payer: Self-pay

## 2021-06-11 NOTE — Telephone Encounter (Signed)
PA for renewal of Brenda Schneider has been submitted. Key: [id number]

## 2021-06-20 ENCOUNTER — Other Ambulatory Visit: Payer: Self-pay | Admitting: Internal Medicine

## 2021-06-20 DIAGNOSIS — I10 Essential (primary) hypertension: Secondary | ICD-10-CM

## 2021-06-20 MED FILL — Levothyroxine Sodium Tab 175 MCG: ORAL | 90 days supply | Qty: 45 | Fill #2 | Status: AC

## 2021-06-21 ENCOUNTER — Other Ambulatory Visit (HOSPITAL_COMMUNITY): Payer: Self-pay

## 2021-06-21 MED ORDER — OLMESARTAN MEDOXOMIL 20 MG PO TABS
20.0000 mg | ORAL_TABLET | Freq: Every day | ORAL | 0 refills | Status: DC
Start: 2021-06-21 — End: 2021-10-03
  Filled 2021-06-21: qty 90, 90d supply, fill #0

## 2021-06-24 DIAGNOSIS — Z0289 Encounter for other administrative examinations: Secondary | ICD-10-CM

## 2021-07-22 ENCOUNTER — Other Ambulatory Visit (HOSPITAL_COMMUNITY): Payer: Self-pay

## 2021-07-25 ENCOUNTER — Other Ambulatory Visit (HOSPITAL_COMMUNITY): Payer: Self-pay

## 2021-07-31 ENCOUNTER — Ambulatory Visit (INDEPENDENT_AMBULATORY_CARE_PROVIDER_SITE_OTHER): Payer: 59 | Admitting: Family Medicine

## 2021-08-03 ENCOUNTER — Other Ambulatory Visit (HOSPITAL_COMMUNITY): Payer: Self-pay

## 2021-08-06 ENCOUNTER — Other Ambulatory Visit: Payer: Self-pay

## 2021-08-06 ENCOUNTER — Ambulatory Visit (AMBULATORY_SURGERY_CENTER): Payer: 59 | Admitting: *Deleted

## 2021-08-06 ENCOUNTER — Other Ambulatory Visit (HOSPITAL_COMMUNITY): Payer: Self-pay

## 2021-08-06 VITALS — Ht 63.0 in | Wt 184.0 lb

## 2021-08-06 DIAGNOSIS — Z800 Family history of malignant neoplasm of digestive organs: Secondary | ICD-10-CM

## 2021-08-06 DIAGNOSIS — Z1211 Encounter for screening for malignant neoplasm of colon: Secondary | ICD-10-CM

## 2021-08-06 MED ORDER — NA SULFATE-K SULFATE-MG SULF 17.5-3.13-1.6 GM/177ML PO SOLN
1.0000 | Freq: Once | ORAL | 0 refills | Status: AC
Start: 2021-08-06 — End: 2021-08-07
  Filled 2021-08-06: qty 354, 1d supply, fill #0

## 2021-08-06 NOTE — Progress Notes (Signed)

## 2021-08-07 ENCOUNTER — Other Ambulatory Visit (HOSPITAL_COMMUNITY): Payer: Self-pay

## 2021-08-13 ENCOUNTER — Encounter: Payer: Self-pay | Admitting: Gastroenterology

## 2021-08-14 ENCOUNTER — Ambulatory Visit (INDEPENDENT_AMBULATORY_CARE_PROVIDER_SITE_OTHER): Payer: 59 | Admitting: Family Medicine

## 2021-08-15 ENCOUNTER — Other Ambulatory Visit: Payer: Self-pay

## 2021-08-15 ENCOUNTER — Other Ambulatory Visit: Payer: Self-pay | Admitting: Internal Medicine

## 2021-08-15 ENCOUNTER — Other Ambulatory Visit (HOSPITAL_COMMUNITY): Payer: Self-pay

## 2021-08-15 ENCOUNTER — Encounter (INDEPENDENT_AMBULATORY_CARE_PROVIDER_SITE_OTHER): Payer: Self-pay | Admitting: Family Medicine

## 2021-08-15 ENCOUNTER — Ambulatory Visit (INDEPENDENT_AMBULATORY_CARE_PROVIDER_SITE_OTHER): Payer: 59 | Admitting: Family Medicine

## 2021-08-15 VITALS — BP 126/70 | HR 72 | Temp 98.2°F | Ht 63.0 in | Wt 193.0 lb

## 2021-08-15 DIAGNOSIS — Z6834 Body mass index (BMI) 34.0-34.9, adult: Secondary | ICD-10-CM | POA: Diagnosis not present

## 2021-08-15 DIAGNOSIS — E063 Autoimmune thyroiditis: Secondary | ICD-10-CM | POA: Diagnosis not present

## 2021-08-15 DIAGNOSIS — F3289 Other specified depressive episodes: Secondary | ICD-10-CM | POA: Diagnosis not present

## 2021-08-15 DIAGNOSIS — R5383 Other fatigue: Secondary | ICD-10-CM

## 2021-08-15 DIAGNOSIS — E669 Obesity, unspecified: Secondary | ICD-10-CM | POA: Diagnosis not present

## 2021-08-15 DIAGNOSIS — I10 Essential (primary) hypertension: Secondary | ICD-10-CM

## 2021-08-15 DIAGNOSIS — E559 Vitamin D deficiency, unspecified: Secondary | ICD-10-CM

## 2021-08-15 DIAGNOSIS — Z9189 Other specified personal risk factors, not elsewhere classified: Secondary | ICD-10-CM

## 2021-08-15 DIAGNOSIS — R739 Hyperglycemia, unspecified: Secondary | ICD-10-CM

## 2021-08-15 DIAGNOSIS — R0602 Shortness of breath: Secondary | ICD-10-CM | POA: Diagnosis not present

## 2021-08-15 MED ORDER — HYDROCHLOROTHIAZIDE 25 MG PO TABS
ORAL_TABLET | Freq: Every day | ORAL | 0 refills | Status: DC
  Filled 2021-08-15: qty 90, 90d supply, fill #0

## 2021-08-16 LAB — COMPREHENSIVE METABOLIC PANEL
ALT: 27 IU/L (ref 0–32)
AST: 23 IU/L (ref 0–40)
Albumin/Globulin Ratio: 1.9 (ref 1.2–2.2)
Albumin: 4.8 g/dL (ref 3.8–4.8)
Alkaline Phosphatase: 85 IU/L (ref 44–121)
BUN/Creatinine Ratio: 17 (ref 9–23)
BUN: 11 mg/dL (ref 6–24)
Bilirubin Total: 0.5 mg/dL (ref 0.0–1.2)
CO2: 26 mmol/L (ref 20–29)
Calcium: 9.8 mg/dL (ref 8.7–10.2)
Chloride: 99 mmol/L (ref 96–106)
Creatinine, Ser: 0.65 mg/dL (ref 0.57–1.00)
Globulin, Total: 2.5 g/dL (ref 1.5–4.5)
Glucose: 92 mg/dL (ref 70–99)
Potassium: 3.5 mmol/L (ref 3.5–5.2)
Sodium: 141 mmol/L (ref 134–144)
Total Protein: 7.3 g/dL (ref 6.0–8.5)
eGFR: 109 mL/min/{1.73_m2} (ref >59)

## 2021-08-16 LAB — CBC WITH DIFFERENTIAL/PLATELET
Basophils Absolute: 0.1 x10E3/uL (ref 0.0–0.2)
Basos: 1 %
EOS (ABSOLUTE): 0.1 x10E3/uL (ref 0.0–0.4)
Eos: 2 %
Hematocrit: 39 % (ref 34.0–46.6)
Hemoglobin: 13 g/dL (ref 11.1–15.9)
Immature Grans (Abs): 0 x10E3/uL (ref 0.0–0.1)
Immature Granulocytes: 0 %
Lymphocytes Absolute: 1.2 x10E3/uL (ref 0.7–3.1)
Lymphs: 19 %
MCH: 27.4 pg (ref 26.6–33.0)
MCHC: 33.3 g/dL (ref 31.5–35.7)
MCV: 82 fL (ref 79–97)
Monocytes Absolute: 0.4 x10E3/uL (ref 0.1–0.9)
Monocytes: 6 %
Neutrophils Absolute: 4.3 x10E3/uL (ref 1.4–7.0)
Neutrophils: 72 %
Platelets: 268 x10E3/uL (ref 150–450)
RBC: 4.75 x10E6/uL (ref 3.77–5.28)
RDW: 13.1 % (ref 11.7–15.4)
WBC: 6 x10E3/uL (ref 3.4–10.8)

## 2021-08-16 LAB — LIPID PANEL WITH LDL/HDL RATIO
Cholesterol, Total: 226 mg/dL — ABNORMAL HIGH (ref 100–199)
HDL: 53 mg/dL (ref >39)
LDL Chol Calc (NIH): 153 mg/dL — ABNORMAL HIGH (ref 0–99)
LDL/HDL Ratio: 2.9 ratio (ref 0.0–3.2)
Triglycerides: 114 mg/dL (ref 0–149)
VLDL Cholesterol Cal: 20 mg/dL (ref 5–40)

## 2021-08-16 LAB — T3: T3, Total: 138 ng/dL (ref 71–180)

## 2021-08-16 LAB — FOLATE: Folate: 4.5 ng/mL (ref >3.0)

## 2021-08-16 LAB — HEMOGLOBIN A1C
Est. average glucose Bld gHb Est-mCnc: 100 mg/dL
Hgb A1c MFr Bld: 5.1 % (ref 4.8–5.6)

## 2021-08-16 LAB — INSULIN, RANDOM: INSULIN: 11.8 u[IU]/mL (ref 2.6–24.9)

## 2021-08-16 LAB — TSH: TSH: 1.67 u[IU]/mL (ref 0.450–4.500)

## 2021-08-16 LAB — VITAMIN D 25 HYDROXY (VIT D DEFICIENCY, FRACTURES): Vit D, 25-Hydroxy: 51.6 ng/mL (ref 30.0–100.0)

## 2021-08-16 LAB — VITAMIN B12: Vitamin B-12: 701 pg/mL (ref 232–1245)

## 2021-08-16 LAB — T4, FREE: Free T4: 1.66 ng/dL (ref 0.82–1.77)

## 2021-08-17 ENCOUNTER — Other Ambulatory Visit (HOSPITAL_COMMUNITY): Payer: Self-pay

## 2021-08-19 NOTE — Progress Notes (Unsigned)
Chief Complaint:   OBESITY Brenda Schneider (MR# [id number]) is a 51 y.o. female who presents for evaluation and treatment of obesity and related comorbidities. Current BMI is Body mass index is 34.19 kg/m. Brenda Schneider has been struggling with her weight for many years and has been unsuccessful in either losing weight, maintaining weight loss, or reaching her healthy weight goal.  Brenda Schneider is currently in the action stage of change and ready to dedicate time achieving and maintaining a healthier weight. Brenda Schneider is interested in becoming our patient and working on intensive lifestyle modifications including (but not limited to) diet and exercise for weight loss.  Brenda Schneider heard about the clinic from [place]. She was previously on Saxenda 3 mg with significant GI side effects of belching. Not appreciable to weight loss. Pt works at [employer] 8-5 M-F. She was on Phentermine in the past and lost 40 lbs. Eggs in morning 1-2 +/- whole wheat toast with avocado (satisfied); Snack- strawberries (3-4) or cottage cheese (1/3 cup); Lunch- wrap 2 slices [place], 1 slice cheese, lettuce, tomato, or chicken breast + broccoli or brussels sprouts with small potato (full); Snack- meat + cheese or handful of nuts or 1/2 cup mixed berried; 5:30-6 PM Dinner- similar to lunch.  Brenda Schneider's habits were reviewed today and are as follows: Her family eats meals together, she thinks her family will eat healthier with her, her desired weight loss is 63 lbs, she started gaining weight in 2011, her heaviest weight ever was 293 pounds, she skips meals frequently, she is frequently drinking liquids with calories, she frequently eats larger portions than normal, and she struggles with emotional eating.  Depression Screen Brenda Schneider's Food and Mood (modified PHQ-9) score was 10.  Depression screen PHQ 2/9 08/15/2021  Decreased Interest 1  Down, Depressed, Hopeless 1  PHQ - 2 Score 2  Altered sleeping 0  Tired, decreased energy 3   Change in appetite 3  Feeling bad or failure about yourself  2  Trouble concentrating 0  Moving slowly or fidgety/restless 0  Suicidal thoughts 0  PHQ-9 Score 10  Difficult doing work/chores Not difficult at all   Subjective:   1. Other fatigue Pt denies daytime sleepiness. She admits waking up tired. Pt reports generally restful sleep. She denies snoring or apneic episodes. Epworth Score: 0. EKG done July 2022 NSR- high voltage, borderline LVH.  2. SOBOE (shortness of breath on exertion) Brenda Schneider notes increasing shortness of breath with exercising and seems to be worsening over time with weight gain. She notes getting out of breath sooner with activity than she used to. This has gotten worse recently. Brenda Schneider denies shortness of breath at rest or orthopnea.  3. Essential hypertension BP labile in the past. Pt denies chest pain/chest pressure/headache. She is on olmesartan, amlodipine, and HCTZ.  4. Acquired autoimmune hypothyroidism [name]. Pt is on levothyroxine 175 mcg and 150 mcg alternating and selenium.  5. Vitamin D deficiency disease Pt was previously on high dose Vit D. She reports fatigue.  6. Elevated blood sugar H/o elevated sugars in the past. She was previously on Saxenda.  7. Other depression, with emotional eating Pt reports increase in symptoms due to her son joining the [employer]. She is not aware she is emotionally eating at the time.  8. At risk for deficient intake of food Brenda Schneider is at risk for deficient intake of food due to inadequate intake.  Assessment/Plan:   1. Other fatigue Brenda Schneider does feel that her weight is  causing her energy to be lower than it should be. Fatigue may be related to obesity, depression or many other causes. Labs will be ordered, and in the meanwhile, [name] will focus on self care including making healthy food choices, increasing physical activity and focusing on stress reduction. Check labs today.  - Vitamin B12 - Folate  2.  SOBOE (shortness of breath on exertion) Brenda Schneider does feel that she gets out of breath more easily that she used to when she exercises. Brenda Schneider's shortness of breath appears to be obesity related and exercise induced. She has agreed to work on weight loss and gradually increase exercise to treat her exercise induced shortness of breath. Will continue to monitor closely. Check labs today.  - CBC with Differential/Platelet - Lipid Panel With LDL/HDL Ratio  3. Essential hypertension Brenda Schneider is working on healthy weight loss and exercise to improve blood pressure control. We will watch for signs of hypotension as she continues her lifestyle modifications. Renal ultrasound- need dopplers of renal artery. Check labs today.  - Comprehensive metabolic panel - VITAMIN D 25 Hydroxy (Vit-D Deficiency, Fractures) - US Renal; Future  4. Acquired autoimmune hypothyroidism Patient with long-standing hypothyroidism, on levothyroxine therapy. She appears euthyroid. Orders and follow up as documented in patient record. F/u with Dr. Cruzita Lederer May 2023.  Counseling Good thyroid control is important for overall health. Supratherapeutic thyroid levels are dangerous and will not improve weight loss results. The correct way to take levothyroxine is fasting, with water, separated by at least 30 minutes from breakfast, and separated by more than 4 hours from calcium, iron, multivitamins, acid reflux medications (PPIs).  Check labs today.  - T3 - T4, free - TSH  5. Vitamin D deficiency disease Check labs today.  - VITAMIN D 25 Hydroxy (Vit-D Deficiency, Fractures)  6. Elevated blood sugar Fasting labs will be obtained and results with be discussed with [name] in 2 weeks at her follow up visit. In the meanwhile Brenda Schneider was started on a lower simple carbohydrate diet and will work on weight loss efforts.  - Hemoglobin A1c - Insulin, random  7. Other depression, with emotional eating Behavior modification techniques were  discussed today to help [name] deal with her emotional/non-hunger eating behaviors.  Orders and follow up as documented in patient record. F/u on events at next appt.  8. At risk for deficient intake of food Kimberely was given approximately 15 minutes of deficient intake of food prevention counseling today. Saidee is at risk for eating too few calories based on current food recall. She was encouraged to focus on meeting caloric and protein goals according to her recommended meal plan.  9. Obesity with current BMI of 34.2 Belissa is currently in the action stage of change and her goal is to continue with weight loss efforts. I recommend Betty begin the structured treatment plan as follows:  She has agreed to the Category 3 Plan.  Exercise goals: No exercise has been prescribed at this time.   Behavioral modification strategies: increasing lean protein intake, meal planning and cooking strategies, and planning for success.  She was informed of the importance of frequent follow-up visits to maximize her success with intensive lifestyle modifications for her multiple health conditions. She was informed we would discuss her lab results at her next visit unless there is a critical issue that needs to be addressed sooner. Joscelynn agreed to keep her next visit at the agreed upon time to discuss these results.  Objective:   Blood pressure 126/70, pulse  72, temperature 98.2 F (36.8 C), height 5\' 3"  (1.6 m), weight 193 lb (87.5 kg), last menstrual period 10/08/2015, SpO2 100 %. Body mass index is 34.19 kg/m.  EKG: Normal sinus rhythm, rate 84.  Indirect Calorimeter completed today shows a VO2 of 257 and a REE of 1771.  Her calculated basal metabolic rate is 6803 thus her basal metabolic rate is better than expected.  General: Cooperative, alert, well developed, in no acute distress. HEENT: Conjunctivae and lids unremarkable. Cardiovascular: Regular rhythm.  Lungs: Normal work of breathing. Neurologic: No  focal deficits.   Lab Results  Component Value Date   CREATININE 0.65 08/15/2021   BUN 11 08/15/2021   NA 141 08/15/2021   K 3.5 08/15/2021   CL 99 08/15/2021   CO2 26 08/15/2021   Lab Results  Component Value Date   ALT 27 08/15/2021   AST 23 08/15/2021   ALKPHOS 85 08/15/2021   BILITOT 0.5 08/15/2021   Lab Results  Component Value Date   HGBA1C 5.1 08/15/2021   Lab Results  Component Value Date   INSULIN 11.8 08/15/2021   Lab Results  Component Value Date   TSH 1.670 08/15/2021   Lab Results  Component Value Date   CHOL 226 (H) 08/15/2021   HDL 53 08/15/2021   LDLCALC 153 (H) 08/15/2021   TRIG 114 08/15/2021   CHOLHDL 4 02/05/2021   Lab Results  Component Value Date   WBC 6.0 08/15/2021   HGB 13.0 08/15/2021   HCT 39.0 08/15/2021   MCV 82 08/15/2021   PLT 268 08/15/2021    Attestation Statements:   Reviewed by clinician on day of visit: allergies, medications, problem list, medical history, surgical history, family history, social history, and previous encounter notes.  Coral Ceo, CMA, am acting as transcriptionist for Coralie Common, MD.   I have reviewed the above documentation for accuracy and completeness, and I agree with the above. - ***

## 2021-08-20 ENCOUNTER — Ambulatory Visit (AMBULATORY_SURGERY_CENTER): Payer: 59 | Admitting: Gastroenterology

## 2021-08-20 ENCOUNTER — Other Ambulatory Visit: Payer: Self-pay

## 2021-08-20 ENCOUNTER — Encounter: Payer: Self-pay | Admitting: Gastroenterology

## 2021-08-20 VITALS — BP 119/53 | HR 63 | Temp 98.7°F | Resp 13 | Ht 63.0 in | Wt 184.0 lb

## 2021-08-20 DIAGNOSIS — D123 Benign neoplasm of transverse colon: Secondary | ICD-10-CM

## 2021-08-20 DIAGNOSIS — Z1211 Encounter for screening for malignant neoplasm of colon: Secondary | ICD-10-CM | POA: Diagnosis not present

## 2021-08-20 DIAGNOSIS — E039 Hypothyroidism, unspecified: Secondary | ICD-10-CM | POA: Diagnosis not present

## 2021-08-20 DIAGNOSIS — Z800 Family history of malignant neoplasm of digestive organs: Secondary | ICD-10-CM | POA: Diagnosis not present

## 2021-08-20 DIAGNOSIS — I10 Essential (primary) hypertension: Secondary | ICD-10-CM | POA: Diagnosis not present

## 2021-08-20 DIAGNOSIS — E669 Obesity, unspecified: Secondary | ICD-10-CM | POA: Diagnosis not present

## 2021-08-20 MED ORDER — SODIUM CHLORIDE 0.9 % IV SOLN: 500.0000 mL | Freq: Once | INTRAVENOUS | Status: DC

## 2021-08-20 NOTE — Progress Notes (Signed)
VSS, transported to PACU °

## 2021-08-20 NOTE — Progress Notes (Signed)
Pt's states no medical or surgical changes since previsit or office visit.  VS CW  

## 2021-08-20 NOTE — Progress Notes (Signed)
Called to room to assist during endoscopic procedure.  Patient ID and intended procedure confirmed with present staff. Received instructions for my participation in the procedure from the performing physician.  

## 2021-08-20 NOTE — Op Note (Addendum)
[place] Patient Name: Brenda Schneider Procedure Date: 08/20/2021 8:39 AM MRN: [id number] Endoscopist: Mauri Pole , MD Age: 51 Referring MD:  Date of Birth: 1970/11/04 Gender: Female Account #: [id number] Procedure:                Colonoscopy Indications:              Screening for colorectal malignant neoplasm,                            Screening in patient at increased risk: Family                            history of 1st-degree relative with colorectal                            cancer Medicines:                Monitored Anesthesia Care Procedure:                Pre-Anesthesia Assessment:                           - Prior to the procedure, a History and Physical                            was performed, and patient medications and                            allergies were reviewed. The patient's tolerance of                            previous anesthesia was also reviewed. The risks                            and benefits of the procedure and the sedation                            options and risks were discussed with the patient.                            All questions were answered, and informed consent                            was obtained. Prior Anticoagulants: The patient has                            taken no previous anticoagulant or antiplatelet                            agents. ASA Grade Assessment: II - A patient with                            mild systemic disease. After reviewing the risks  and benefits, the patient was deemed in                            satisfactory condition to undergo the procedure.                           After obtaining informed consent, the colonoscope                            was passed under direct vision. Throughout the                            procedure, the patient's blood pressure, pulse, and                            oxygen saturations were monitored continuously. The                             Olympus [id number]-[id number]) Colonoscope was                            introduced through the anus and advanced to the the                            cecum, identified by appendiceal orifice and                            ileocecal valve. The colonoscopy was performed                            without difficulty. The patient tolerated the                            procedure well. The quality of the bowel                            preparation was excellent. The ileocecal valve,                            appendiceal orifice, and rectum were photographed. Scope In: 8:47:57 AM Scope Out: 9:03:30 AM Scope Withdrawal Time: 0 hours 11 minutes 48 seconds  Total Procedure Duration: 0 hours 15 minutes 33 seconds  Findings:                 The perianal and digital rectal examinations were                            normal.                           A 3 mm polyp was found in the transverse colon. The                            polyp was sessile. The polyp was removed with a  cold snare. Resection and retrieval were complete.                           Multiple small and large-mouthed diverticula were                            found in the sigmoid colon, transverse colon,                            ascending colon and cecum.                           Non-bleeding external and internal hemorrhoids were                            found during retroflexion. The hemorrhoids were                            small. Complications:            No immediate complications. Estimated Blood Loss:     Estimated blood loss was minimal. Impression:               - One 3 mm polyp in the transverse colon, removed                            with a cold snare. Resected and retrieved.                           - Moderate diverticulosis in the sigmoid colon, in                            the transverse colon, in the ascending colon and in                            the  cecum.                           - Non-bleeding external and internal hemorrhoids. Recommendation:           - Patient has a contact number available for                            emergencies. The signs and symptoms of potential                            delayed complications were discussed with the                            patient. Return to normal activities tomorrow.                            Written discharge instructions were provided to the                            patient.                           -  Resume previous diet.                           - Continue present medications.                           - Await pathology results.                           - Repeat colonoscopy in 5 years for surveillance                            based on pathology results. Mauri Pole, MD 08/20/2021 9:07:42 AM This report has been signed electronically.

## 2021-08-20 NOTE — Patient Instructions (Addendum)
Handouts given for polyps.  Resume previous diet.  Continue present medications.  Await pathology results.  YOU HAD AN ENDOSCOPIC PROCEDURE TODAY AT [facility]:   Refer to the procedure report that was given to you for any specific questions about what was found during the examination.  If the procedure report does not answer your questions, please call your gastroenterologist to clarify.  If you requested that your care partner not be given the details of your procedure findings, then the procedure report has been included in a sealed envelope for you to review at your convenience later.  YOU SHOULD EXPECT: Some feelings of bloating in the abdomen. Passage of more gas than usual.  Walking can help get rid of the air that was put into your GI tract during the procedure and reduce the bloating. If you had a lower endoscopy (such as a colonoscopy or flexible sigmoidoscopy) you may notice spotting of blood in your stool or on the toilet paper. If you underwent a bowel prep for your procedure, you may not have a normal bowel movement for a few days.  Please Note:  You might notice some irritation and congestion in your nose or some drainage.  This is from the oxygen used during your procedure.  There is no need for concern and it should clear up in a day or so.  SYMPTOMS TO REPORT IMMEDIATELY:  Following lower endoscopy (colonoscopy or flexible sigmoidoscopy):  Excessive amounts of blood in the stool  Significant tenderness or worsening of abdominal pains  Swelling of the abdomen that is new, acute  Fever of 100F or higher  For urgent or emergent issues, a gastroenterologist can be reached at any hour by calling [phone]. Do not use MyChart messaging for urgent concerns.    DIET:  We do recommend a small meal at first, but then you may proceed to your regular diet.  Drink plenty of fluids but you should avoid alcoholic beverages for 24 hours.  ACTIVITY:  You should plan to  take it easy for the rest of today and you should NOT DRIVE or use heavy machinery until tomorrow (because of the sedation medicines used during the test).    FOLLOW UP: Our staff will call the number listed on your records 48-72 hours following your procedure to check on you and address any questions or concerns that you may have regarding the information given to you following your procedure. If we do not reach you, we will leave a message.  We will attempt to reach you two times.  During this call, we will ask if you have developed any symptoms of COVID 19. If you develop any symptoms (ie: fever, flu-like symptoms, shortness of breath, cough etc.) before then, please call [phone].  If you test positive for Covid 19 in the 2 weeks post procedure, please call and report this information to [place].    If any biopsies were taken you will be contacted by phone or by letter within the next 1-3 weeks.  Please call us at [phone] if you have not heard about the biopsies in 3 weeks.    SIGNATURES/CONFIDENTIALITY: You and/or your care partner have signed paperwork which will be entered into your electronic medical record.  These signatures attest to the fact that that the information above on your After Visit Summary has been reviewed and is understood.  Full responsibility of the confidentiality of this discharge information lies with you and/or your care-partner.

## 2021-08-20 NOTE — Progress Notes (Signed)
[facility] History and Physical   Primary Care Physician:  Janith Lima, MD   Reason for Procedure:  Colorectal cancer screening  Plan:    Screening colonoscopy with possible interventions as needed     HPI: Brenda Schneider is a very pleasant 51 y.o. female here for screening colonoscopy. Denies any nausea, vomiting, abdominal pain, melena or bright red blood per rectum  The risks and benefits as well as alternatives of endoscopic procedure(s) have been discussed and reviewed. All questions answered. The patient agrees to proceed.    Past Medical History:  Diagnosis Date   Acquired autoimmune hypothyroidism followed by dr Dwyane Dee   secondary to hashimoto thyroiditis-- followed by dr Dwyane Dee   Allergy    SEASONAL   Constipation    Hypertension    Hypothyroidism    Menorrhagia    Murmur 02/03/1980   Obesity    Thyroid nodule    Uterine fibroid    Vitamin D deficiency     Past Surgical History:  Procedure Laterality Date   HYSTEROSCOPY WITH D & C  2007   LAPAROSCOPIC CHOLECYSTECTOMY  Feb 1999   LAPAROSCOPIC VAGINAL HYSTERECTOMY WITH SALPINGECTOMY Bilateral 11/01/2015   Procedure: LAPAROSCOPIC ASSISTED VAGINAL HYSTERECTOMY WITH SALPINGECTOMY;  Surgeon: Dian Queen, MD;  Location: [facility];  Service: Gynecology;  Laterality: Bilateral;  VAGINAL   TONSILLECTOMY  1991   TUBAL LIGATION  Mar 1999   TYMPANOSTOMY TUBE PLACEMENT Bilateral 1980's    Prior to Admission medications   Medication Sig Start Date End Date Taking? Authorizing Provider  amLODipine (NORVASC) 10 MG tablet TAKE 1 TABLET BY MOUTH ONCE DAILY 05/02/21 05/02/22 Yes Janith Lima, MD  docusate sodium (COLACE) 100 MG capsule Take 100 mg by mouth 2 (two) times daily.   Yes [provider]  hydrochlorothiazide (HYDRODIURIL) 25 MG tablet TAKE 1 TABLET (25 MG TOTAL) BY MOUTH DAILY. 08/15/21 11/15/21 Yes Janith Lima, MD  levothyroxine (SYNTHROID) 150 MCG tablet TAKE 1  TABLET BY MOUTH EVERY OTHER DAY 11/27/20 11/27/21 Yes Philemon Kingdom, MD  levothyroxine (SYNTHROID) 175 MCG tablet TAKE 1 TABLET BY MOUTH EVERY OTHER DAY 08/23/20 09/19/21 Yes Philemon Kingdom, MD  loratadine (CLARITIN) 10 MG tablet Take 10 mg by mouth daily.   Yes [provider]  olmesartan (BENICAR) 20 MG tablet Take 1 tablet (20 mg total) by mouth daily. 06/21/21  Yes Janith Lima, MD  selenium 50 MCG TABS tablet Take 50 mcg by mouth daily.   Yes [provider]  acyclovir (ZOVIRAX) 400 MG tablet Take 400 mg by mouth 5 (five) times daily.    [provider]  albuterol (VENTOLIN HFA) 108 (90 Base) MCG/ACT inhaler INHALE 2 PUFFS BY MOUTH EVERY 6 HOURS AS NEEDED FOR WHEEZING OR SHORTNESS OF BREATH. 07/15/20 07/15/21  Lavera Guise, MD  phentermine 37.5 MG capsule Take 1 capsule (37.5 mg total) by mouth every morning. 06/20/20 09/27/20  Luiz Ochoa, NP    Current Outpatient Medications  Medication Sig Dispense Refill   amLODipine (NORVASC) 10 MG tablet TAKE 1 TABLET BY MOUTH ONCE DAILY 90 tablet 0   docusate sodium (COLACE) 100 MG capsule Take 100 mg by mouth 2 (two) times daily.     hydrochlorothiazide (HYDRODIURIL) 25 MG tablet TAKE 1 TABLET (25 MG TOTAL) BY MOUTH DAILY. 90 tablet 0   levothyroxine (SYNTHROID) 150 MCG tablet TAKE 1 TABLET BY MOUTH EVERY OTHER DAY 45 tablet 3   levothyroxine (SYNTHROID) 175 MCG tablet TAKE 1 TABLET BY  MOUTH EVERY OTHER DAY 45 tablet 3   loratadine (CLARITIN) 10 MG tablet Take 10 mg by mouth daily.     olmesartan (BENICAR) 20 MG tablet Take 1 tablet (20 mg total) by mouth daily. 90 tablet 0   selenium 50 MCG TABS tablet Take 50 mcg by mouth daily.     acyclovir (ZOVIRAX) 400 MG tablet Take 400 mg by mouth 5 (five) times daily.     albuterol (VENTOLIN HFA) 108 (90 Base) MCG/ACT inhaler INHALE 2 PUFFS BY MOUTH EVERY 6 HOURS AS NEEDED FOR WHEEZING OR SHORTNESS OF BREATH. 18 g 0   Current Facility-Administered Medications  Medication  Dose Route Frequency Provider Last Rate Last Admin   0.9 %  sodium chloride infusion  500 mL Intravenous Once Crosby Oriordan, Venia Minks, MD        Allergies as of 08/20/2021   (No Known Allergies)    Family History  Problem Relation Age of Onset   Hypertension Mother    Cancer Mother    Depression Mother    Colon cancer Father    Hypertension Father    Hearing loss Father    Asthma Brother    Hypertension Brother    Cancer Maternal Grandmother    Hypertension Maternal Grandfather    Hyperlipidemia Maternal Grandfather    Cancer Maternal Grandfather    Hypertension Paternal Grandmother    Hyperlipidemia Paternal Grandmother    COPD Paternal Grandmother    Thyroid disease Paternal Grandmother    Heart disease Paternal Grandmother    Hypertension Paternal Grandfather    Hyperlipidemia Paternal Grandfather    Heart disease Paternal Grandfather    Hypertension Other    Hyperlipidemia Other    Heart disease Other    Early death Other    Diabetes Neg Hx    Esophageal cancer Neg Hx    Rectal cancer Neg Hx    Stomach cancer Neg Hx     Social History   Socioeconomic History   Marital status: Married    Spouse name: Not on file   Number of children: Not on file   Years of education: Not on file   Highest education level: Not on file  Occupational History   Occupation: Nurse  Tobacco Use   Smoking status: Never   Smokeless tobacco: Never  Vaping Use   Vaping Use: Never used  Substance and Sexual Activity   Alcohol use: Never   Drug use: No   Sexual activity: Yes    Partners: Male    Birth control/protection: Surgical  Other Topics Concern   Not on file  Social History Narrative   Not on file   Social Determinants of Health   Financial Resource Strain: Not on file  Food Insecurity: Not on file  Transportation Needs: Not on file  Physical Activity: Not on file  Stress: Not on file  Social Connections: Not on file  Intimate Partner Violence: Not on file     Review of Systems:  All other review of systems negative except as mentioned in the HPI.  Physical Exam: Vital signs in last 24 hours: BP (!) 112/52    Pulse 77    Temp 98.7 F (37.1 C)    Ht 5\' 3"  (1.6 m)    Wt 184 lb (83.5 kg)    LMP 10/08/2015 (Exact Date)    SpO2 100%    BMI 32.59 kg/m  General:   Alert, NAD Lungs:  Clear .   Heart:  Regular rate and rhythm  Abdomen:  Soft, nontender and nondistended. Neuro/Psych:  Alert and cooperative. Normal mood and affect. A and O x 3  Reviewed labs, radiology imaging, old records and pertinent past GI work up  Patient is appropriate for planned procedure(s) and anesthesia in an ambulatory setting   K. Denzil Magnuson , MD [phone]

## 2021-08-21 ENCOUNTER — Other Ambulatory Visit: Payer: Self-pay | Admitting: Internal Medicine

## 2021-08-21 ENCOUNTER — Ambulatory Visit (INDEPENDENT_AMBULATORY_CARE_PROVIDER_SITE_OTHER): Payer: Self-pay | Admitting: Family Medicine

## 2021-08-21 ENCOUNTER — Other Ambulatory Visit (HOSPITAL_COMMUNITY): Payer: Self-pay

## 2021-08-21 DIAGNOSIS — I10 Essential (primary) hypertension: Secondary | ICD-10-CM

## 2021-08-21 MED ORDER — AMLODIPINE BESYLATE 10 MG PO TABS
ORAL_TABLET | Freq: Every day | ORAL | 0 refills | Status: DC
Start: 2021-08-21 — End: 2021-12-04
  Filled 2021-08-21: qty 90, 90d supply, fill #0

## 2021-08-22 ENCOUNTER — Telehealth: Payer: Self-pay

## 2021-08-22 ENCOUNTER — Telehealth: Payer: Self-pay | Admitting: *Deleted

## 2021-08-22 NOTE — Telephone Encounter (Signed)
Attempted f/u call. No answer, VM not setup. 

## 2021-08-22 NOTE — Telephone Encounter (Signed)
°  Follow up Call-  Call back number 08/20/2021  Post procedure Call Back phone  # [phone]  Permission to leave phone message Yes  Some recent data might be hidden   LMOM to call back with any questions or concerns.  Also, call back if patient has developed fever, respiratory issues or been dx with COVID or had any family members or close contacts diagnosed since her procedure.

## 2021-08-26 NOTE — Progress Notes (Signed)
No addendum, clicked by mistake.

## 2021-08-27 ENCOUNTER — Encounter: Payer: Self-pay | Admitting: Gastroenterology

## 2021-08-28 ENCOUNTER — Ambulatory Visit (INDEPENDENT_AMBULATORY_CARE_PROVIDER_SITE_OTHER): Payer: Self-pay | Admitting: Family Medicine

## 2021-08-29 ENCOUNTER — Other Ambulatory Visit: Payer: Self-pay

## 2021-08-29 ENCOUNTER — Ambulatory Visit (INDEPENDENT_AMBULATORY_CARE_PROVIDER_SITE_OTHER): Payer: 59 | Admitting: Family Medicine

## 2021-08-29 ENCOUNTER — Encounter (INDEPENDENT_AMBULATORY_CARE_PROVIDER_SITE_OTHER): Payer: Self-pay | Admitting: Family Medicine

## 2021-08-29 VITALS — BP 129/76 | HR 90 | Temp 98.4°F | Ht 63.0 in | Wt 196.0 lb

## 2021-08-29 DIAGNOSIS — Z6834 Body mass index (BMI) 34.0-34.9, adult: Secondary | ICD-10-CM | POA: Diagnosis not present

## 2021-08-29 DIAGNOSIS — E669 Obesity, unspecified: Secondary | ICD-10-CM | POA: Diagnosis not present

## 2021-08-29 DIAGNOSIS — E8881 Metabolic syndrome: Secondary | ICD-10-CM | POA: Diagnosis not present

## 2021-08-29 DIAGNOSIS — E7849 Other hyperlipidemia: Secondary | ICD-10-CM | POA: Diagnosis not present

## 2021-08-29 DIAGNOSIS — E559 Vitamin D deficiency, unspecified: Secondary | ICD-10-CM

## 2021-08-29 DIAGNOSIS — I10 Essential (primary) hypertension: Secondary | ICD-10-CM | POA: Diagnosis not present

## 2021-08-29 DIAGNOSIS — Z9189 Other specified personal risk factors, not elsewhere classified: Secondary | ICD-10-CM

## 2021-08-29 MED ORDER — VITAMIN D3 125 MCG (5000 UT) PO CAPS: 5000.0000 [IU] | ORAL_CAPSULE | Freq: Every day | ORAL | 0 refills | Status: AC

## 2021-09-02 NOTE — Progress Notes (Signed)
Chief Complaint:   OBESITY Brenda Schneider is here to discuss her progress with her obesity treatment plan along with follow-up of her obesity related diagnoses. Brenda Schneider is on the Category 3 Plan and states she is following her eating plan approximately 100% of the time. Brenda Schneider states she is walking 30 minutes 5 times per week.  Today's visit was #: 2 Starting weight: 193 lbs Starting date: 08/15/2021 Today's weight: 196 lbs Today's date: 08/29/2021 Total lbs lost to date: 0 Total lbs lost since last in-office visit: 0  Interim History: Pt had a colonoscopy during the last 2 weeks. She followed plan 100% of the time for a week and felt it was a lot of food. She is still doing typical walking. Pt wants to commit to category 3 over the next few weeks. She does not foresee any obstacles in the next two weeks.  Subjective:   1. Other hyperlipidemia Discussed labs with patient today. Brenda Schneider has an LDL of 153, HDL 53, and triglycerides 114. She is not on statin. ASCVD 1.8%.  The 10-year ASCVD risk score (Arnett DK, et al., 2019) is: 1.8%   Values used to calculate the score:     Age: 51 years     Sex: Female     Is Non-Hispanic African American: No     Diabetic: No     Tobacco smoker: No     Systolic Blood Pressure: 425 mmHg     Is BP treated: Yes     HDL Cholesterol: 53 mg/dL     Total Cholesterol: 226 mg/dL  2. Vitamin D deficiency Discussed labs with patient today. At goal. Pt reports fatigue. She was previously on Rx Vit D.  3. Insulin resistance Discussed labs with patient today. Brenda Schneider's A1c is 5.1 with an insulin level of 11.8. She have some increased desire for carbs.  4. Essential hypertension BP well controlled. Brenda Schneider denies chest pain/chest pressure/headache.  5. At risk of diabetes mellitus Brenda Schneider is at higher than average risk for developing diabetes due to her obesity.  Assessment/Plan:   1. Other hyperlipidemia Cardiovascular risk and specific lipid/LDL goals  reviewed.  We discussed several lifestyle modifications today and [place] will continue to work on diet, exercise and weight loss efforts. Orders and follow up as documented in patient record. Repeat labs in 3 months. No meds a this time. Lifestyle changes encouraged.  Counseling Intensive lifestyle modifications are the first line treatment for this issue. Dietary changes: Increase soluble fiber. Decrease simple carbohydrates. Exercise changes: Moderate to vigorous-intensity aerobic activity 150 minutes per week if tolerated. Lipid-lowering medications: see documented in medical record.  2. Vitamin D deficiency Low Vitamin D level contributes to fatigue and are associated with obesity, breast, and colon cancer. She agrees to start to take prescription Vitamin D 5,000 IU daily and will follow-up for routine testing of Vitamin D, at least 2-3 times per year to avoid over-replacement.  Start- Cholecalciferol (VITAMIN D3) 125 MCG (5000 UT) CAPS; Take 1 capsule (5,000 Units total) by mouth daily.  Dispense: 30 capsule; Refill: 0  3. Insulin resistance Brenda Schneider will continue to work on weight loss, exercise, and decreasing simple carbohydrates to help decrease the risk of diabetes. Brenda Schneider agreed to follow-up with [place] as directed to closely monitor her progress. Repeat labs in 3 months.  4. Essential hypertension Brenda Schneider is working on healthy weight loss and exercise to improve blood pressure control. We will watch for signs of hypotension as she continues her lifestyle modifications. Continue  current meds with no change in doses yet.  5. At risk of diabetes mellitus Brenda Schneider was given approximately 30 minutes of diabetic education and counseling today. We discussed intensive lifestyle modifications today with an emphasis on weight loss as well as increasing exercise and decreasing simple carbohydrates in her diet. We also reviewed medication options with an emphasis on risk versus benefits of those  discussed.  Repetitive spaced learning was employed today to elicit superior memory formation and behavioral change.  6. Obesity with current BMI of 34.7 Brenda Schneider is currently in the action stage of change. As such, her goal is to continue with weight loss efforts. She has agreed to the Category 3 Plan.   Exercise goals: All adults should avoid inactivity. Some physical activity is better than none, and adults who participate in any amount of physical activity gain some health benefits.  Behavioral modification strategies: increasing lean protein intake, meal planning and cooking strategies, keeping healthy foods in the home, and planning for success.  Brenda Schneider has agreed to follow-up with our clinic in 2 weeks. She was informed of the importance of frequent follow-up visits to maximize her success with intensive lifestyle modifications for her multiple health conditions.   Objective:   Blood pressure 129/76, pulse 90, temperature 98.4 F (36.9 C), height 5\' 3"  (1.6 m), weight 196 lb (88.9 kg), last menstrual period 10/08/2015, SpO2 98 %. Body mass index is 34.72 kg/m.  General: Cooperative, alert, well developed, in no acute distress. HEENT: Conjunctivae and lids unremarkable. Cardiovascular: Regular rhythm.  Lungs: Normal work of breathing. Neurologic: No focal deficits.   Lab Results  Component Value Date   CREATININE 0.65 08/15/2021   BUN 11 08/15/2021   NA 141 08/15/2021   K 3.5 08/15/2021   CL 99 08/15/2021   CO2 26 08/15/2021   Lab Results  Component Value Date   ALT 27 08/15/2021   AST 23 08/15/2021   ALKPHOS 85 08/15/2021   BILITOT 0.5 08/15/2021   Lab Results  Component Value Date   HGBA1C 5.1 08/15/2021   Lab Results  Component Value Date   INSULIN 11.8 08/15/2021   Lab Results  Component Value Date   TSH 1.670 08/15/2021   Lab Results  Component Value Date   CHOL 226 (H) 08/15/2021   HDL 53 08/15/2021   LDLCALC 153 (H) 08/15/2021   TRIG 114 08/15/2021    CHOLHDL 4 02/05/2021   Lab Results  Component Value Date   VD25OH 51.6 08/15/2021   VD25OH 19.50 (L) 02/05/2021   Lab Results  Component Value Date   WBC 6.0 08/15/2021   HGB 13.0 08/15/2021   HCT 39.0 08/15/2021   MCV 82 08/15/2021   PLT 268 08/15/2021   Attestation Statements:   Reviewed by clinician on day of visit: allergies, medications, problem list, medical history, surgical history, family history, social history, and previous encounter notes.  Coral Ceo, CMA, am acting as transcriptionist for Coralie Common, MD.   I have reviewed the above documentation for accuracy and completeness, and I agree with the above. - Coralie Common, MD

## 2021-09-19 ENCOUNTER — Ambulatory Visit (INDEPENDENT_AMBULATORY_CARE_PROVIDER_SITE_OTHER): Payer: 59 | Admitting: Family Medicine

## 2021-10-03 ENCOUNTER — Other Ambulatory Visit: Payer: Self-pay | Admitting: Internal Medicine

## 2021-10-03 ENCOUNTER — Other Ambulatory Visit (HOSPITAL_COMMUNITY): Payer: Self-pay

## 2021-10-03 MED ORDER — OLMESARTAN MEDOXOMIL 20 MG PO TABS
20.0000 mg | ORAL_TABLET | Freq: Every day | ORAL | 0 refills | Status: DC
  Filled 2021-10-03: qty 90, 90d supply, fill #0

## 2021-10-03 MED ORDER — LEVOTHYROXINE SODIUM 175 MCG PO TABS
ORAL_TABLET | ORAL | 3 refills | Status: AC
  Filled 2021-10-03: qty 45, 90d supply, fill #0
  Filled 2022-01-15: qty 45, 90d supply, fill #1

## 2021-11-20 ENCOUNTER — Other Ambulatory Visit: Payer: Self-pay | Admitting: Internal Medicine

## 2021-11-20 ENCOUNTER — Encounter: Payer: Self-pay | Admitting: Internal Medicine

## 2021-11-20 DIAGNOSIS — I10 Essential (primary) hypertension: Secondary | ICD-10-CM

## 2021-11-21 ENCOUNTER — Other Ambulatory Visit: Payer: Self-pay | Admitting: Internal Medicine

## 2021-11-21 ENCOUNTER — Other Ambulatory Visit (HOSPITAL_COMMUNITY): Payer: Self-pay

## 2021-11-21 MED ORDER — HYDROCHLOROTHIAZIDE 25 MG PO TABS
ORAL_TABLET | Freq: Every day | ORAL | 0 refills | Status: DC
  Filled 2021-11-21: qty 90, 90d supply, fill #0

## 2021-11-26 ENCOUNTER — Ambulatory Visit: Payer: 59 | Admitting: Internal Medicine

## 2021-12-04 ENCOUNTER — Other Ambulatory Visit: Payer: Self-pay | Admitting: Internal Medicine

## 2021-12-04 ENCOUNTER — Other Ambulatory Visit (HOSPITAL_COMMUNITY): Payer: Self-pay

## 2021-12-04 DIAGNOSIS — I10 Essential (primary) hypertension: Secondary | ICD-10-CM

## 2021-12-05 ENCOUNTER — Other Ambulatory Visit (HOSPITAL_COMMUNITY): Payer: Self-pay

## 2021-12-05 MED ORDER — AMLODIPINE BESYLATE 10 MG PO TABS
ORAL_TABLET | Freq: Every day | ORAL | 0 refills | Status: DC
  Filled 2021-12-05: qty 90, 90d supply, fill #0

## 2022-01-15 ENCOUNTER — Other Ambulatory Visit (HOSPITAL_COMMUNITY): Payer: Self-pay

## 2022-01-15 ENCOUNTER — Other Ambulatory Visit: Payer: Self-pay | Admitting: Internal Medicine

## 2022-01-15 DIAGNOSIS — E063 Autoimmune thyroiditis: Secondary | ICD-10-CM

## 2022-01-16 ENCOUNTER — Other Ambulatory Visit (HOSPITAL_COMMUNITY): Payer: Self-pay

## 2022-01-16 MED ORDER — LEVOTHYROXINE SODIUM 150 MCG PO TABS
ORAL_TABLET | ORAL | 3 refills | Status: AC
  Filled 2022-01-16: qty 45, 90d supply, fill #0

## 2022-01-17 ENCOUNTER — Other Ambulatory Visit (HOSPITAL_COMMUNITY): Payer: Self-pay

## 2022-02-26 ENCOUNTER — Encounter (INDEPENDENT_AMBULATORY_CARE_PROVIDER_SITE_OTHER): Payer: Self-pay

## 2022-05-02 ENCOUNTER — Encounter: Payer: Self-pay | Admitting: Internal Medicine

## 2022-05-05 ENCOUNTER — Other Ambulatory Visit (HOSPITAL_COMMUNITY): Payer: Self-pay

## 2022-05-05 ENCOUNTER — Other Ambulatory Visit: Payer: Self-pay | Admitting: Internal Medicine

## 2022-05-05 DIAGNOSIS — I10 Essential (primary) hypertension: Secondary | ICD-10-CM

## 2022-05-07 ENCOUNTER — Other Ambulatory Visit: Payer: Self-pay | Admitting: Internal Medicine

## 2022-05-07 DIAGNOSIS — I10 Essential (primary) hypertension: Secondary | ICD-10-CM

## 2022-05-07 MED ORDER — HYDROCHLOROTHIAZIDE 25 MG PO TABS: ORAL_TABLET | Freq: Every day | ORAL | 0 refills | Status: AC

## 2022-05-07 MED ORDER — OLMESARTAN MEDOXOMIL 20 MG PO TABS: 20.0000 mg | ORAL_TABLET | Freq: Every day | ORAL | 0 refills | Status: AC

## 2022-05-07 MED ORDER — AMLODIPINE BESYLATE 10 MG PO TABS: ORAL_TABLET | Freq: Every day | ORAL | 0 refills | Status: AC

## 2022-06-02 ENCOUNTER — Other Ambulatory Visit: Payer: Self-pay | Admitting: Internal Medicine

## 2022-06-02 DIAGNOSIS — I10 Essential (primary) hypertension: Secondary | ICD-10-CM

## 2023-03-13 IMAGING — CT CT HEAD W/O CM
3 series · 15 of 44 positions shown, 18 images · non-contrast
Comparison: None.

CLINICAL DATA: Pain after hit by golf ball, left temporal region

EXAM:
CT HEAD WITHOUT CONTRAST
TECHNIQUE: Contiguous axial images were obtained from the base of the skull
through the vertex without intravenous contrast.

[Series 2: head wo · axial · 0.40mm/px · z∈[-112,-2]mm · 9 of 27 slices shown, 12 images]
[im 3/27  brain]
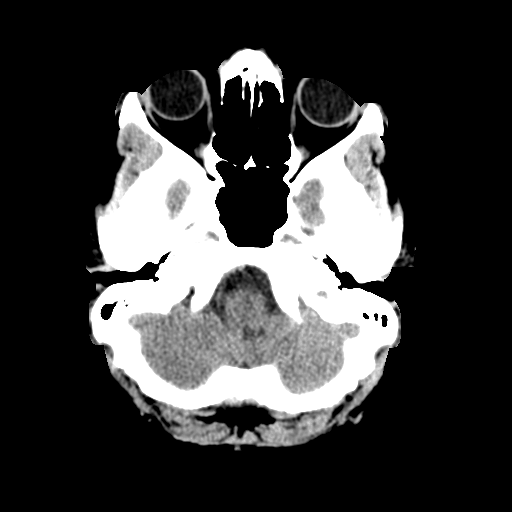
[im 3/27  bone]
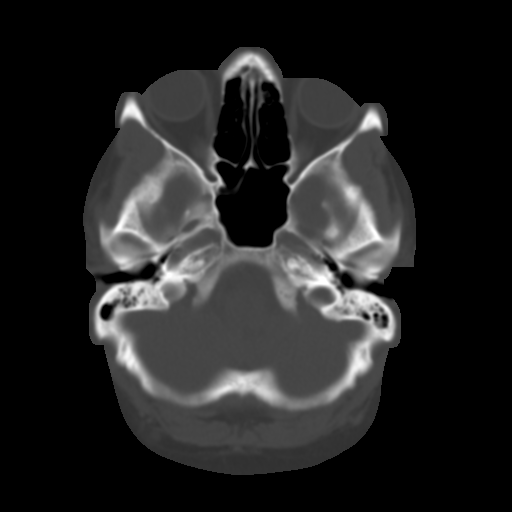
[im 6/27  brain]
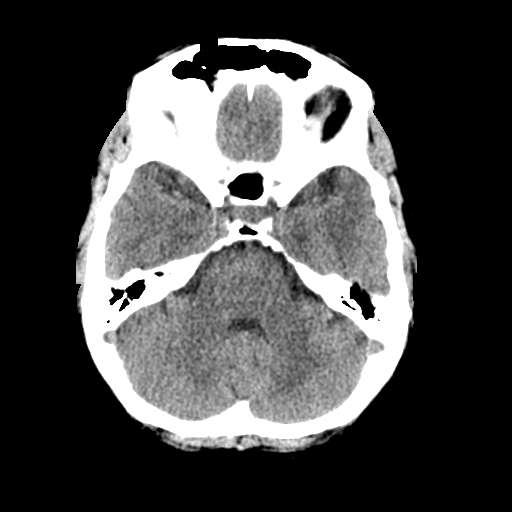
[im 8/27  brain]
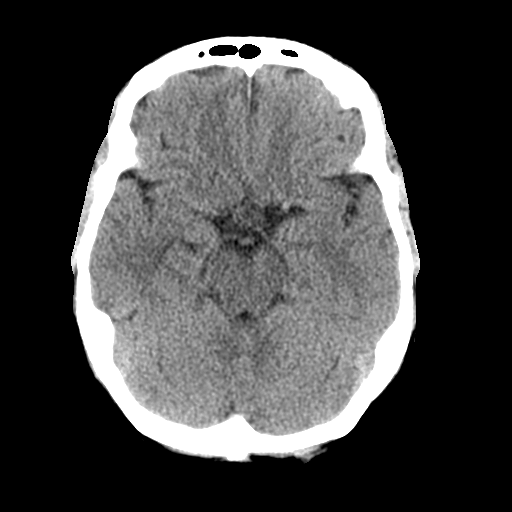
[im 11/27  brain]
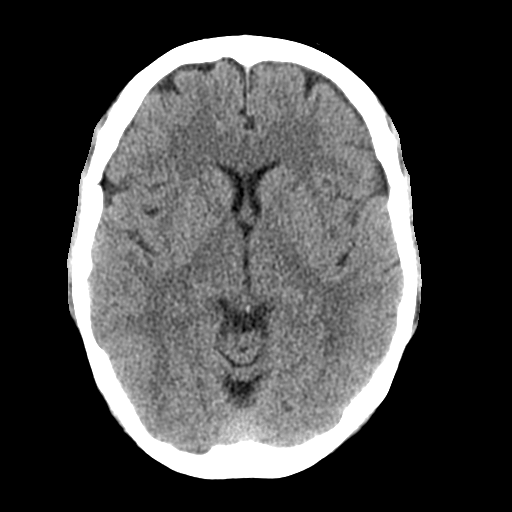
[im 14/27  brain]
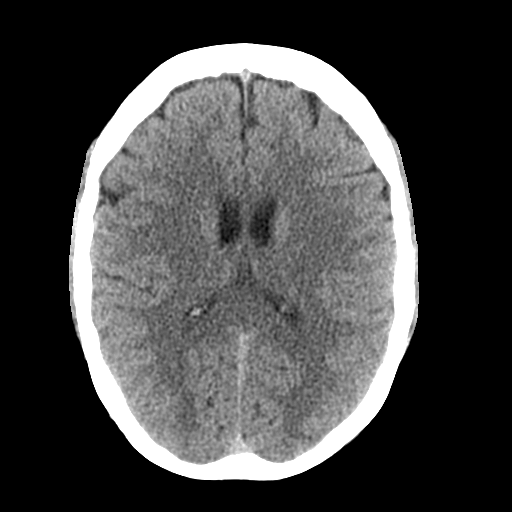
[im 14/27  bone]
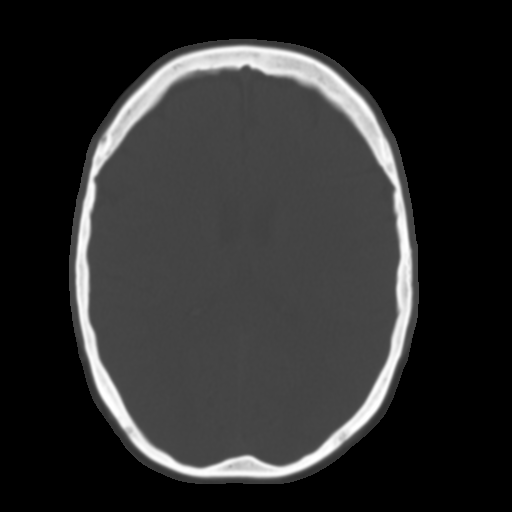
[im 17/27  brain]
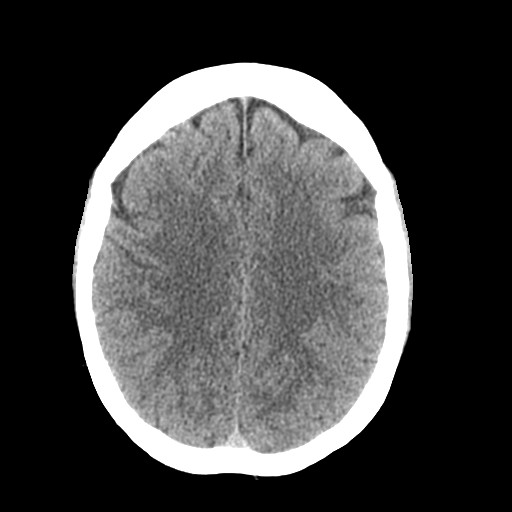
[im 20/27  brain]
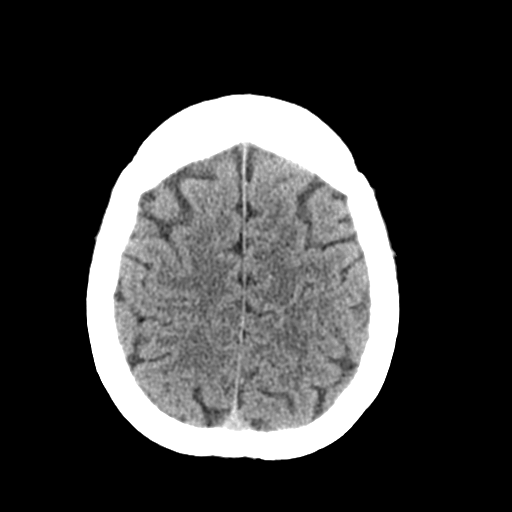
[im 22/27  brain]
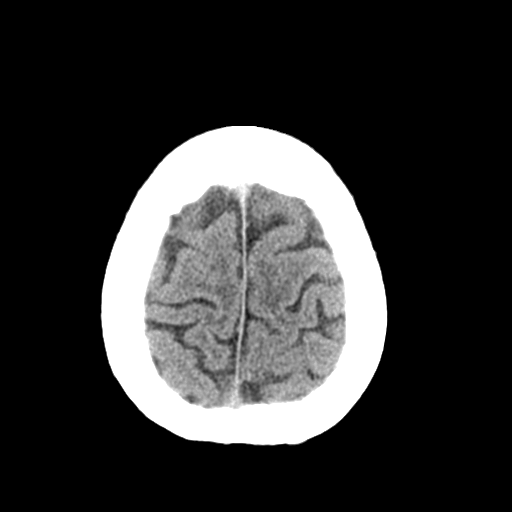
[im 25/27  brain]
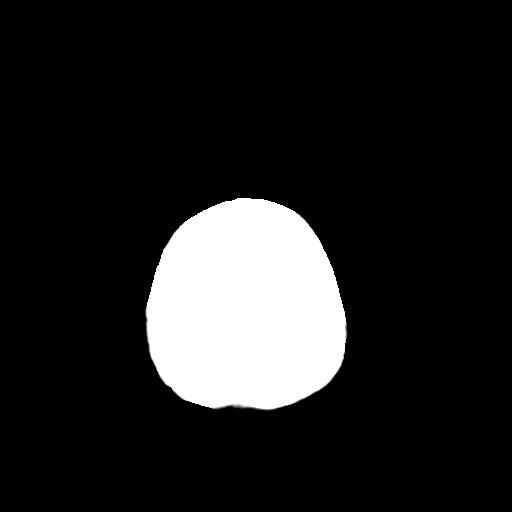
[im 25/27  bone]
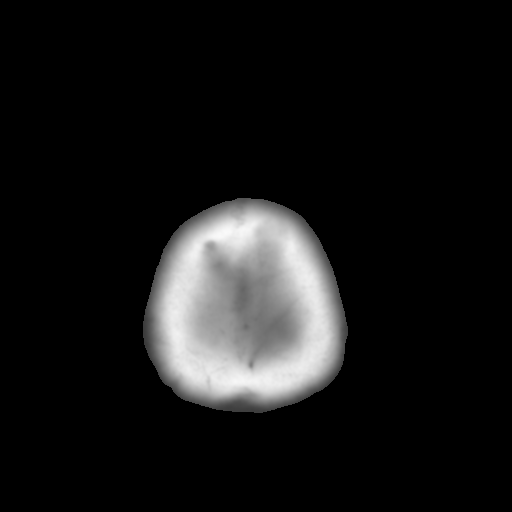

[Series 4: coronal soft tissue · coronal · 0.27mm/px · 3 of 63 slices shown]
[im 21/63  brain]
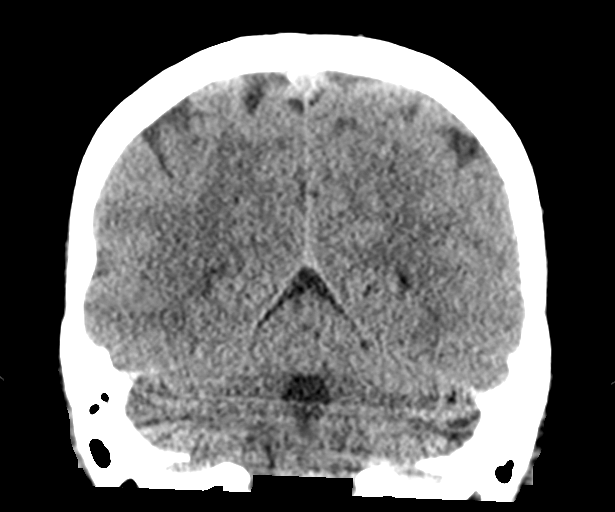
[im 28/63  brain]
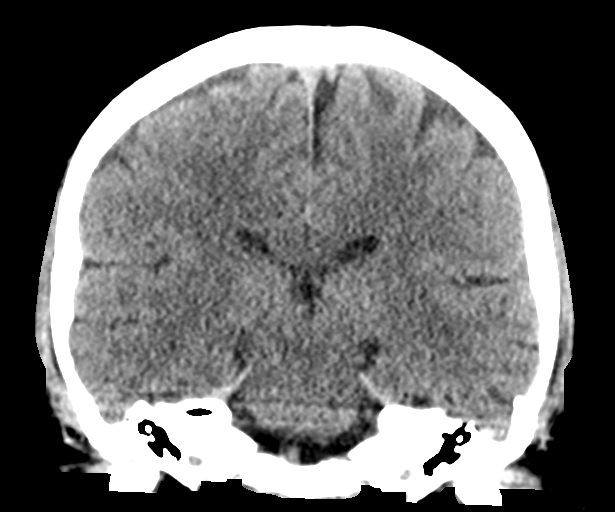
[im 35/63  brain]
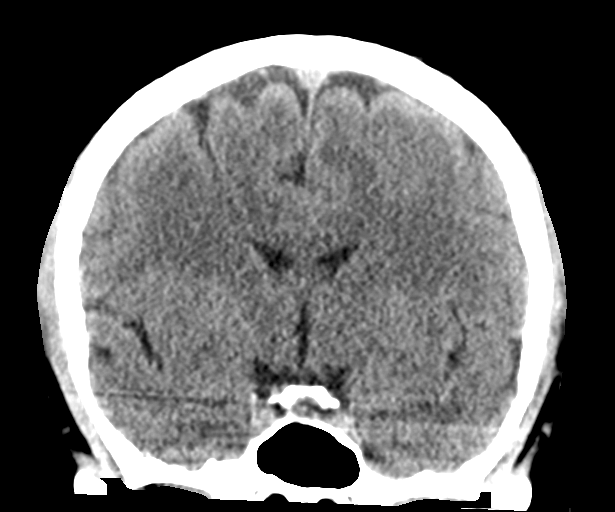

[Series 5: sagittal soft tissue · sagittal · 0.28mm/px · 3 of 58 slices shown]
[im 20/58  brain]
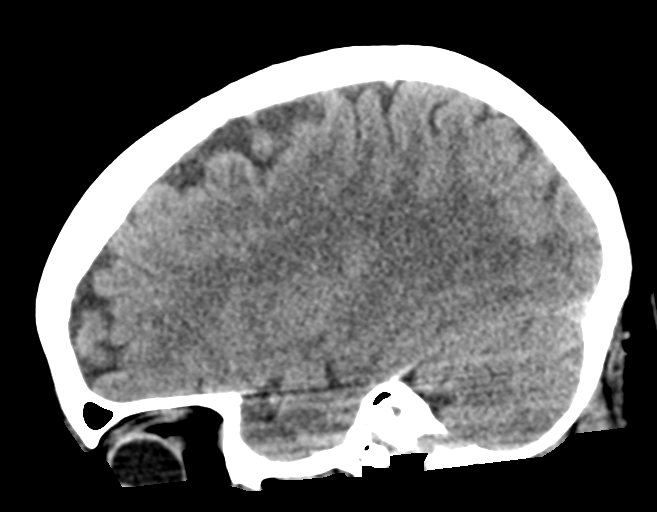
[im 29/58  brain]
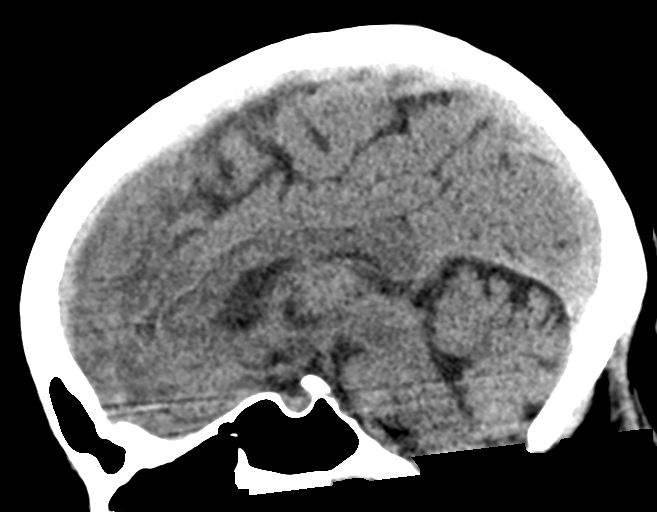
[im 39/58  brain]
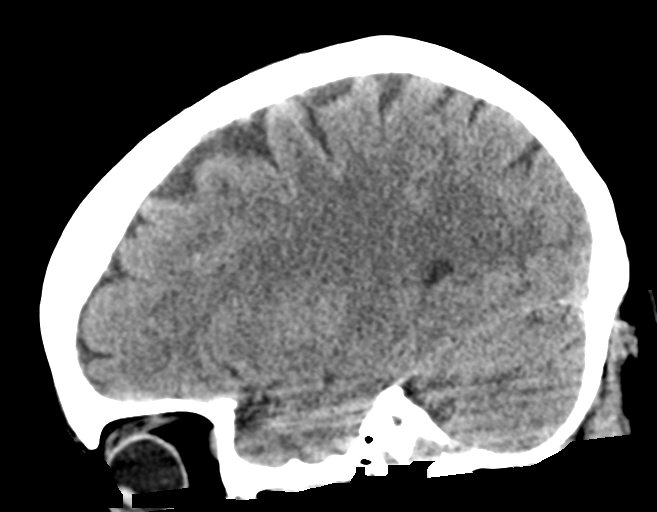

[15 of 44 positions shown; findings below may reference images not displayed]

FINDINGS: Brain: Ventricles and sulci are normal in size and configuration.
There is no intracranial mass, hemorrhage, extra-axial fluid
collection, or midline shift. The brain parenchyma appears
unremarkable. No acute infarct is evident.

Vascular: No hyperdense vessel.  No evident vascular calcification.

Skull: Bony calvarium appears intact.

Sinuses/Orbits: Visualized paranasal sinuses are clear. Visualized
orbits appear symmetric bilaterally.

Other: Mastoid air cells are clear.
IMPRESSION: Study within normal limits.
